# Patient Record
Sex: Female | Born: 1945 | Race: White | Hispanic: No | Marital: Married | Smoking: Former smoker
Health system: Southern US, Community
[De-identification: ages and names within clinical notes are randomized; demographics above are authoritative.]

## PROBLEM LIST (undated history)

## (undated) DIAGNOSIS — I639 Cerebral infarction, unspecified: Secondary | ICD-10-CM

## (undated) DIAGNOSIS — E7800 Pure hypercholesterolemia, unspecified: Secondary | ICD-10-CM

## (undated) DIAGNOSIS — I10 Essential (primary) hypertension: Secondary | ICD-10-CM

## (undated) HISTORY — DX: Essential (primary) hypertension: I10

## (undated) HISTORY — DX: Cerebral infarction, unspecified: I63.9

## (undated) HISTORY — PX: PLACEMENT OF BREAST IMPLANTS: SHX6334

## (undated) HISTORY — DX: Pure hypercholesterolemia, unspecified: E78.00

---

## 2015-03-28 ENCOUNTER — Emergency Department
Admission: EM | Admit: 2015-03-28 | Discharge: 2015-03-28 | Disposition: A | Discharge status: Other Acute Inpatient Hospital | Payer: BLUE CROSS/BLUE SHIELD | Source: Home / Self Care

## 2016-12-01 ENCOUNTER — Encounter: Payer: Self-pay | Admitting: Sports Medicine

## 2016-12-01 ENCOUNTER — Ambulatory Visit (INDEPENDENT_AMBULATORY_CARE_PROVIDER_SITE_OTHER): Payer: BLUE CROSS/BLUE SHIELD | Admitting: Sports Medicine

## 2016-12-01 ENCOUNTER — Ambulatory Visit (INDEPENDENT_AMBULATORY_CARE_PROVIDER_SITE_OTHER): Payer: BLUE CROSS/BLUE SHIELD

## 2016-12-01 DIAGNOSIS — R4702 Dysphasia: Secondary | ICD-10-CM

## 2016-12-01 DIAGNOSIS — Z0000 Encounter for general adult medical examination without abnormal findings: Secondary | ICD-10-CM | POA: Insufficient documentation

## 2016-12-01 DIAGNOSIS — E785 Hyperlipidemia, unspecified: Secondary | ICD-10-CM | POA: Diagnosis not present

## 2016-12-01 DIAGNOSIS — I639 Cerebral infarction, unspecified: Secondary | ICD-10-CM

## 2016-12-01 DIAGNOSIS — I10 Essential (primary) hypertension: Secondary | ICD-10-CM

## 2016-12-01 MED ORDER — LISINOPRIL-HYDROCHLOROTHIAZIDE 20-25 MG PO TABS: 0.5000 | ORAL_TABLET | Freq: Every day | ORAL | 3 refills | Status: DC

## 2016-12-01 MED ORDER — ASPIRIN EC 81 MG PO TBEC: 81.0000 mg | DELAYED_RELEASE_TABLET | Freq: Every day | ORAL | 3 refills | Status: DC

## 2016-12-01 NOTE — Progress Notes (Signed)
  Subjective:    CC: Establish care.   HPI:  This is a pleasant 71 year old female, she has a history of strokes in the past, uncontrolled hypertension. She was an avid smoker. A week ago she developed a episode of aphasia followed by dysphasia with slurred speech. This was while on an airplane, she never sought medical care afterwards and has not had any imaging. She has had some improvement but has persistent slurring of her speech, no new onset symptoms over the past week. No weakness or numbness on either side of the body. No headaches, visual changes, chest pain.  Past medical history:  Negative.  See flowsheet/record as well for more information.  Surgical history: Negative.  See flowsheet/record as well for more information.  Family history: Negative.  See flowsheet/record as well for more information.  Social history: Negative.  See flowsheet/record as well for more information.  Allergies, and medications have been entered into the medical record, reviewed, and no changes needed.    Review of Systems: No headache, visual changes, nausea, vomiting, diarrhea, constipation, dizziness, abdominal pain, skin rash, fevers, chills, night sweats, swollen lymph nodes, weight loss, chest pain, body aches, joint swelling, muscle aches, shortness of breath, mood changes, visual or auditory hallucinations.  Objective:    General: Well Developed, well nourished, and in no acute distress.  Neuro: Alert and oriented x3, extra-ocular muscles intact, sensation grossly intact. Cranial nerves II through XII are intact, motor, sensory, coordinative functions are intact, she does speak with some degree of dysphasia. No dysdiadochokinesis, no finger dysmetria, no pronator drift.   HEENT: Normocephalic, atraumatic, pupils equal round reactive to light, neck supple, no masses, no lymphadenopathy, thyroid nonpalpable.  Skin: Warm and dry, no rashes noted.  Cardiac: Regular rate and rhythm, no murmurs rubs or  gallops.  Respiratory: Clear to auscultation bilaterally. Not using accessory muscles, speaking in full sentences.  Abdominal: Soft, nontender, nondistended, positive bowel sounds, no masses, no organomegaly.  Musculoskeletal: Shoulder, elbow, wrist, hip, knee, ankle stable, and with full range of motion.  Twelve-lead ECG personally reviewed, shows normal sinus rhythm at a rate of 78 bpm, normal axis, no PR interval abnormalities and no ST changes.  Impression and Recommendations:    The patient was counselled, risk factors were discussed, anticipatory guidance given.  Benign essential hypertension Starting lisinopril/HCTZ, checking blood work.  Annual physical exam Mammogram, DEXA scan ordered. I will catch her up on screening tests at the follow-up visit. She will need her pneumonia 13, flu vaccine, tetanus vaccine, and we will discuss colon cancer screening at that time.  Dysphasia History of stroke in the past, we can go while on a plane she developed severe dysphagia, was never treated. CT of the head will be done stat today. Continue aspirin 81 mg. Adding a brain MRI with and without contrast, carotid Dopplers, echocardiogram, electrocardiogram, referral to neurology.

## 2016-12-01 NOTE — Assessment & Plan Note (Addendum)
Mammogram, DEXA scan ordered. I will catch her up on screening tests at the follow-up visit. She will need her pneumonia 13, flu vaccine, tetanus vaccine, and we will discuss colon cancer screening at that time.

## 2016-12-01 NOTE — Assessment & Plan Note (Signed)
Starting lisinopril/HCTZ, checking blood work.

## 2016-12-01 NOTE — Addendum Note (Signed)
Addended by: Collie SiadICHARDSON, Vernadette Stutsman M on: 12/01/2016 04:20 PM   Modules accepted: Orders

## 2016-12-01 NOTE — Assessment & Plan Note (Addendum)
History of stroke in the past, we can go while on a plane she developed severe dysphagia, was never treated. CT of the head will be done stat today. Continue aspirin 81 mg. Adding a brain MRI with and without contrast, carotid Dopplers, echocardiogram, electrocardiogram, referral to neurology.  MRI confirms subacute right thalamic infarct.  Called patient, she is feeling better and dysphasia is stable. No issues with BP medication and dizziness has resolved. Keep close followup this coming week.

## 2016-12-02 ENCOUNTER — Ambulatory Visit (HOSPITAL_BASED_OUTPATIENT_CLINIC_OR_DEPARTMENT_OTHER)
Admission: RE | Admit: 2016-12-02 | Discharge: 2016-12-02 | Disposition: A | Discharge status: Home / Self Care | Payer: BLUE CROSS/BLUE SHIELD | Source: Ambulatory Visit | Attending: Sports Medicine | Admitting: Sports Medicine

## 2016-12-02 DIAGNOSIS — I638 Other cerebral infarction: Secondary | ICD-10-CM | POA: Diagnosis not present

## 2016-12-02 DIAGNOSIS — I6782 Cerebral ischemia: Secondary | ICD-10-CM | POA: Diagnosis not present

## 2016-12-02 DIAGNOSIS — R4702 Dysphasia: Secondary | ICD-10-CM | POA: Insufficient documentation

## 2016-12-02 LAB — CBC
HCT: 41.2 % (ref 35.0–45.0)
Hemoglobin: 13.5 g/dL (ref 11.7–15.5)
MCH: 32.1 pg (ref 27.0–33.0)
MCHC: 32.8 g/dL (ref 32.0–36.0)
MCV: 98.1 fL (ref 80.0–100.0)
MPV: 12.9 fL — ABNORMAL HIGH (ref 7.5–12.5)
Platelets: 274 10*3/uL (ref 140–400)
RBC: 4.2 MIL/uL (ref 3.80–5.10)
RDW: 14.4 % (ref 11.0–15.0)
WBC: 7 K/uL (ref 3.8–10.8)

## 2016-12-02 LAB — LIPID PANEL W/REFLEX DIRECT LDL
Cholesterol: 263 mg/dL — ABNORMAL HIGH (ref <200)
HDL: 95 mg/dL (ref >50)
LDL-Cholesterol: 149 mg/dL — ABNORMAL HIGH
Non-HDL Cholesterol (Calc): 168 mg/dL — ABNORMAL HIGH (ref <130)
Total CHOL/HDL Ratio: 2.8 ratio (ref <5.0)
Triglycerides: 82 mg/dL (ref <150)

## 2016-12-02 LAB — COMPREHENSIVE METABOLIC PANEL
ALT: 16 U/L (ref 6–29)
Alkaline Phosphatase: 63 U/L (ref 33–130)
BUN: 10 mg/dL (ref 7–25)
Calcium: 9.8 mg/dL (ref 8.6–10.4)
Chloride: 101 mmol/L (ref 98–110)
Potassium: 4.6 mmol/L (ref 3.5–5.3)
Total Protein: 7.4 g/dL (ref 6.1–8.1)

## 2016-12-02 LAB — HEMOGLOBIN A1C
Hgb A1c MFr Bld: 4.9 % (ref <5.7)
Mean Plasma Glucose: 94 mg/dL

## 2016-12-02 LAB — COMPREHENSIVE METABOLIC PANEL WITH GFR
AST: 20 U/L (ref 10–35)
Albumin: 4.5 g/dL (ref 3.6–5.1)
CO2: 24 mmol/L (ref 20–31)
Creat: 0.64 mg/dL (ref 0.60–0.93)
Glucose, Bld: 95 mg/dL (ref 65–99)
Sodium: 138 mmol/L (ref 135–146)
Total Bilirubin: 0.6 mg/dL (ref 0.2–1.2)

## 2016-12-02 LAB — TSH: TSH: 1.2 mIU/L

## 2016-12-02 LAB — HEPATITIS C ANTIBODY: HCV Ab: NEGATIVE

## 2016-12-02 MED ORDER — GADOBENATE DIMEGLUMINE 529 MG/ML IV SOLN: 10.0000 mL | Freq: Once | INTRAVENOUS | Status: DC | PRN

## 2016-12-03 DIAGNOSIS — E785 Hyperlipidemia, unspecified: Secondary | ICD-10-CM | POA: Insufficient documentation

## 2016-12-03 LAB — HIV ANTIBODY (ROUTINE TESTING W REFLEX): HIV 1&2 Ab, 4th Generation: NONREACTIVE

## 2016-12-03 MED ORDER — ATORVASTATIN CALCIUM 20 MG PO TABS: 20.0000 mg | ORAL_TABLET | Freq: Every day | ORAL | 3 refills | Status: DC

## 2016-12-03 NOTE — Addendum Note (Signed)
Addended by: Monica BectonHEKKEKANDAM, Calbert Hulsebus J on: 12/03/2016 09:40 AM   Modules accepted: Orders

## 2016-12-03 NOTE — Assessment & Plan Note (Signed)
With CVA on brain MRI, discussed with patient, adding lipitor. Recheck in 3 months.

## 2016-12-05 ENCOUNTER — Encounter: Payer: Self-pay | Admitting: Sports Medicine

## 2016-12-05 ENCOUNTER — Ambulatory Visit (INDEPENDENT_AMBULATORY_CARE_PROVIDER_SITE_OTHER): Payer: BLUE CROSS/BLUE SHIELD | Admitting: Sports Medicine

## 2016-12-05 DIAGNOSIS — I10 Essential (primary) hypertension: Secondary | ICD-10-CM

## 2016-12-05 DIAGNOSIS — I639 Cerebral infarction, unspecified: Secondary | ICD-10-CM | POA: Diagnosis not present

## 2016-12-05 DIAGNOSIS — E785 Hyperlipidemia, unspecified: Secondary | ICD-10-CM

## 2016-12-05 NOTE — Progress Notes (Signed)
  Subjective:    CC: Follow-up  HPI: This is a pleasant 71 year old female, she came in with a several day history of difficulty speaking, slurred speech. She has a history of strokes in the past. Subsequent urgent MRI with and without contrast showed an acute right thalamic infarct. Overall her speech improved, she is only left with a bit of numbness in her tongue and slightly slurred speech. Her blood pressure was very elevated, and has improved with starting antihypertensives. She is also doing well with her atorvastatin and aspirin. She has her echocardiogram scheduled, carotid Dopplers are not yet scheduled. She declined her neurology appointment until talking to me, I have encouraged her to keep this appointment.  Past medical history:  Negative.  See flowsheet/record as well for more information.  Surgical history: Negative.  See flowsheet/record as well for more information.  Family history: Negative.  See flowsheet/record as well for more information.  Social history: Negative.  See flowsheet/record as well for more information.  Allergies, and medications have been entered into the medical record, reviewed, and no changes needed.   Review of Systems: No fevers, chills, night sweats, weight loss, chest pain, or shortness of breath.   Objective:    General: Well Developed, well nourished, and in no acute distress.  Neuro: Alert and oriented x3, extra-ocular muscles intact, sensation grossly intact.  HEENT: Normocephalic, atraumatic, pupils equal round reactive to light, neck supple, no masses, no lymphadenopathy, thyroid nonpalpable.  Skin: Warm and dry, no rashes. Cardiac: Regular rate and rhythm, no murmurs rubs or gallops, no lower extremity edema.  Respiratory: Clear to auscultation bilaterally. Not using accessory muscles, speaking in full sentences.  Impression and Recommendations:    CVA (cerebral vascular accident) (HCC) Subacute right thalamic infarct, also multiple areas  of brain tissue loss from previous strokes. Awaiting carotid Dopplers, echocardiogram. She did not make her neurology appointment, I have advised her to do so. Continue aspirin 81, we are going to aggressively control her blood pressure and cholesterol.  Benign essential hypertension Increasing to a full tablet lisinopril/HCTZ. Return in one week.  Hyperlipidemia LDL goal <100 Continue atorvastatin for now.  I spent 25 minutes with this patient, greater than 50% was face-to-face time counseling regarding the above diagnoses

## 2016-12-05 NOTE — Assessment & Plan Note (Signed)
Subacute right thalamic infarct, also multiple areas of brain tissue loss from previous strokes. Awaiting carotid Dopplers, echocardiogram. She did not make her neurology appointment, I have advised her to do so. Continue aspirin 81, we are going to aggressively control her blood pressure and cholesterol.

## 2016-12-05 NOTE — Assessment & Plan Note (Signed)
Continue atorvastatin for now. 

## 2016-12-05 NOTE — Assessment & Plan Note (Signed)
Increasing to a full tablet lisinopril/HCTZ. Return in one week.

## 2016-12-06 ENCOUNTER — Other Ambulatory Visit (HOSPITAL_BASED_OUTPATIENT_CLINIC_OR_DEPARTMENT_OTHER): Payer: Medicare Other

## 2016-12-06 ENCOUNTER — Ambulatory Visit (HOSPITAL_BASED_OUTPATIENT_CLINIC_OR_DEPARTMENT_OTHER)
Admission: RE | Admit: 2016-12-06 | Discharge: 2016-12-06 | Disposition: A | Discharge status: Home / Self Care | Payer: BLUE CROSS/BLUE SHIELD | Source: Ambulatory Visit | Attending: Sports Medicine | Admitting: Sports Medicine

## 2016-12-06 DIAGNOSIS — I6523 Occlusion and stenosis of bilateral carotid arteries: Secondary | ICD-10-CM | POA: Insufficient documentation

## 2016-12-06 DIAGNOSIS — R4702 Dysphasia: Secondary | ICD-10-CM | POA: Diagnosis not present

## 2016-12-12 ENCOUNTER — Ambulatory Visit (INDEPENDENT_AMBULATORY_CARE_PROVIDER_SITE_OTHER): Payer: BLUE CROSS/BLUE SHIELD

## 2016-12-12 ENCOUNTER — Encounter: Payer: Self-pay | Admitting: Sports Medicine

## 2016-12-12 ENCOUNTER — Ambulatory Visit (INDEPENDENT_AMBULATORY_CARE_PROVIDER_SITE_OTHER): Payer: BLUE CROSS/BLUE SHIELD | Admitting: Sports Medicine

## 2016-12-12 VITALS — BP 152/90 | HR 106 | Resp 16 | Wt 120.1 lb

## 2016-12-12 DIAGNOSIS — Z0000 Encounter for general adult medical examination without abnormal findings: Secondary | ICD-10-CM

## 2016-12-12 DIAGNOSIS — I639 Cerebral infarction, unspecified: Secondary | ICD-10-CM | POA: Diagnosis not present

## 2016-12-12 DIAGNOSIS — Z23 Encounter for immunization: Secondary | ICD-10-CM

## 2016-12-12 DIAGNOSIS — M47812 Spondylosis without myelopathy or radiculopathy, cervical region: Secondary | ICD-10-CM | POA: Diagnosis not present

## 2016-12-12 DIAGNOSIS — I10 Essential (primary) hypertension: Secondary | ICD-10-CM | POA: Diagnosis not present

## 2016-12-12 DIAGNOSIS — M542 Cervicalgia: Secondary | ICD-10-CM | POA: Diagnosis not present

## 2016-12-12 DIAGNOSIS — G8929 Other chronic pain: Secondary | ICD-10-CM

## 2016-12-12 MED ORDER — AMLODIPINE BESYLATE 5 MG PO TABS: 5.0000 mg | ORAL_TABLET | Freq: Every day | ORAL | 3 refills | Status: DC

## 2016-12-12 MED ORDER — ACETAMINOPHEN ER 650 MG PO TBCR: 650.0000 mg | EXTENDED_RELEASE_TABLET | Freq: Three times a day (TID) | ORAL | 3 refills | Status: DC | PRN

## 2016-12-12 NOTE — Progress Notes (Signed)
  Subjective:    CC: Follow-up  HPI: This is a pleasant 71 year old female, she just had a thalamic stroke, overall improving, still has some slurred speech. Blood pressure continues to improve, still awaiting echocardiogram, carotid Dopplers showed nonobstructive atherosclerosis.  Hyperlipidemia: Continue with cholesterol medication.  Left sided shoulder pain: Radiating from the neck, nothing overtly radicular, worse with turning neck to the left side, not worse with moving the shoulder or overhead activities.  Past medical history:  Negative.  See flowsheet/record as well for more information.  Surgical history: Negative.  See flowsheet/record as well for more information.  Family history: Negative.  See flowsheet/record as well for more information.  Social history: Negative.  See flowsheet/record as well for more information.  Allergies, and medications have been entered into the medical record, reviewed, and no changes needed.   Review of Systems: No fevers, chills, night sweats, weight loss, chest pain, or shortness of breath.   Objective:    General: Well Developed, well nourished, and in no acute distress.  Neuro: Alert and oriented x3, extra-ocular muscles intact, sensation grossly intact. Mild slurred speech HEENT: Normocephalic, atraumatic, pupils equal round reactive to light, neck supple, no masses, no lymphadenopathy, thyroid nonpalpable.  Skin: Warm and dry, no rashes. Cardiac: Regular rate and rhythm, no murmurs rubs or gallops, no lower extremity edema.  Respiratory: Clear to auscultation bilaterally. Not using accessory muscles, speaking in full sentences. Neck: Negative spurling's Full neck range of motion Grip strength and sensation normal in bilateral hands Strength good C4 to T1 distribution No sensory change to C4 to T1 Reflexes normal Shoulder: Inspection reveals no abnormalities, atrophy or asymmetry. Palpation is normal with no tenderness over AC joint or  bicipital groove. ROM is full in all planes. Rotator cuff strength normal throughout. No signs of impingement with negative Neer and Hawkin's tests, empty can. Speeds and Yergason's tests normal. No labral pathology noted with negative Obrien's, negative crank, negative clunk, and good stability. Normal scapular function observed. No painful arc and no drop arm sign. No apprehension sign  Impression and Recommendations:    Annual physical exam Tetanus shot, pneumonia 13. Ordering mammogram, DEXA scan, ColoGuard testing.   Benign essential hypertension Continues to improve. Adding 5 mg of amlodipine, return in 2 weeks.  CVA (cerebral vascular accident) (HCC) Subacute right thalamic infarct with multiple areas of brain tissue loss from previous strokes. Persistent mild dysphasia Carotid Dopplers show nonobstructive atherosclerosis as expected, still awaiting echocardiogram. She does have appointment with neurology in April and she will continue aspirin.  Cervical spondylosis Tylenol over-the-counter, neck x-rays, home rehabilitation exercises given. Return in one month, MRI for consideration of injection if no better.

## 2016-12-12 NOTE — Assessment & Plan Note (Addendum)
Continues to improve. Adding 5 mg of amlodipine, return in 2 weeks.

## 2016-12-12 NOTE — Assessment & Plan Note (Signed)
Tylenol over-the-counter, neck x-rays, home rehabilitation exercises given. Return in one month, MRI for consideration of injection if no better.

## 2016-12-12 NOTE — Assessment & Plan Note (Signed)
Tetanus shot, pneumonia 13. Ordering mammogram, DEXA scan, ColoGuard testing.

## 2016-12-12 NOTE — Assessment & Plan Note (Signed)
Subacute right thalamic infarct with multiple areas of brain tissue loss from previous strokes. Persistent mild dysphasia Carotid Dopplers show nonobstructive atherosclerosis as expected, still awaiting echocardiogram. She does have appointment with neurology in April and she will continue aspirin.

## 2016-12-18 ENCOUNTER — Other Ambulatory Visit: Payer: Self-pay | Admitting: Sports Medicine

## 2016-12-18 DIAGNOSIS — Z780 Asymptomatic menopausal state: Secondary | ICD-10-CM

## 2016-12-18 DIAGNOSIS — Z1239 Encounter for other screening for malignant neoplasm of breast: Secondary | ICD-10-CM

## 2016-12-18 DIAGNOSIS — M810 Age-related osteoporosis without current pathological fracture: Secondary | ICD-10-CM

## 2016-12-20 ENCOUNTER — Telehealth: Payer: Self-pay

## 2016-12-20 NOTE — Telephone Encounter (Signed)
Message from answering service:  CANCEL APPT 12/25/16@3 :00PM &WANTS [facility] TO CALL      TO RESCHEDULE

## 2016-12-21 ENCOUNTER — Ambulatory Visit (HOSPITAL_BASED_OUTPATIENT_CLINIC_OR_DEPARTMENT_OTHER)
Admission: RE | Admit: 2016-12-21 | Discharge: 2016-12-21 | Disposition: A | Discharge status: Home / Self Care | Payer: BLUE CROSS/BLUE SHIELD | Source: Ambulatory Visit | Attending: Sports Medicine | Admitting: Sports Medicine

## 2016-12-21 DIAGNOSIS — I10 Essential (primary) hypertension: Secondary | ICD-10-CM | POA: Diagnosis not present

## 2016-12-21 DIAGNOSIS — R4702 Dysphasia: Secondary | ICD-10-CM | POA: Diagnosis not present

## 2016-12-21 DIAGNOSIS — I639 Cerebral infarction, unspecified: Secondary | ICD-10-CM | POA: Diagnosis not present

## 2016-12-21 DIAGNOSIS — I348 Other nonrheumatic mitral valve disorders: Secondary | ICD-10-CM | POA: Insufficient documentation

## 2016-12-21 LAB — ECHOCARDIOGRAM COMPLETE
E decel time: 225 ms
E/e' ratio: 8.01
FS: 34 % (ref 28–44)
IVS/LV PW RATIO, ED: 0.8
LA ID, A-P, ES: 32 mm
LA diam end sys: 32 mm
LA diam index: 2.06 cm/m2
LA vol A4C: 26.4 ml
LA vol index: 18.7 mL/m2
LA vol: 29 mL
LV E/e' medial: 8.01
LV E/e'average: 8.01
LV PW d: 12.7 mm — AB (ref 0.6–1.1)
LV e' LATERAL: 7.72 cm/s
LVOT SV: 81 mL
LVOT VTI: 25.9 cm
LVOT area: 3.14 cm2
LVOT diameter: 20 mm
LVOT peak grad rest: 7 mmHg
LVOT peak vel: 133 cm/s
Lateral S' vel: 9.79 cm/s
MV Dec: 225
MV pk A vel: 103 m/s
MV pk E vel: 61.8 m/s
TAPSE: 16.8 mm
TDI e' lateral: 7.72
TDI e' medial: 5.22

## 2016-12-21 NOTE — Progress Notes (Signed)
  Echocardiogram 2D Echocardiogram has been performed.  Delcie Roch 12/21/2016, 9:14 AM

## 2016-12-21 NOTE — Telephone Encounter (Signed)
Called and LM on VM to call and R/S appt dg

## 2016-12-25 ENCOUNTER — Ambulatory Visit: Payer: Medicare Other | Admitting: Neurology

## 2016-12-26 ENCOUNTER — Ambulatory Visit (INDEPENDENT_AMBULATORY_CARE_PROVIDER_SITE_OTHER): Payer: BLUE CROSS/BLUE SHIELD

## 2016-12-26 DIAGNOSIS — M810 Age-related osteoporosis without current pathological fracture: Secondary | ICD-10-CM | POA: Insufficient documentation

## 2016-12-26 DIAGNOSIS — Z1231 Encounter for screening mammogram for malignant neoplasm of breast: Secondary | ICD-10-CM | POA: Diagnosis not present

## 2016-12-26 MED ORDER — ALENDRONATE SODIUM 70 MG PO TABS: 70.0000 mg | ORAL_TABLET | ORAL | 11 refills | Status: DC

## 2016-12-26 NOTE — Assessment & Plan Note (Signed)
Starting Fosamax. 

## 2016-12-27 ENCOUNTER — Encounter: Payer: Self-pay | Admitting: Sports Medicine

## 2016-12-27 ENCOUNTER — Ambulatory Visit (INDEPENDENT_AMBULATORY_CARE_PROVIDER_SITE_OTHER): Payer: BLUE CROSS/BLUE SHIELD | Admitting: Sports Medicine

## 2016-12-27 DIAGNOSIS — I10 Essential (primary) hypertension: Secondary | ICD-10-CM | POA: Diagnosis not present

## 2016-12-27 DIAGNOSIS — I639 Cerebral infarction, unspecified: Secondary | ICD-10-CM

## 2016-12-27 MED ORDER — AMLODIPINE BESYLATE 10 MG PO TABS: 10.0000 mg | ORAL_TABLET | Freq: Every day | ORAL | 3 refills | Status: DC

## 2016-12-27 NOTE — Progress Notes (Signed)
  Subjective:    CC: Follow-up  HPI: Osteoporosis: New diagnosis, Fosamax called in.  Hypertension: Continues to improve.  Past medical history:  Negative.  See flowsheet/record as well for more information.  Surgical history: Negative.  See flowsheet/record as well for more information.  Family history: Negative.  See flowsheet/record as well for more information.  Social history: Negative.  See flowsheet/record as well for more information.  Allergies, and medications have been entered into the medical record, reviewed, and no changes needed.   Review of Systems: No fevers, chills, night sweats, weight loss, chest pain, or shortness of breath.   Objective:    General: Well Developed, well nourished, and in no acute distress.  Neuro: Alert and oriented x3, extra-ocular muscles intact, sensation grossly intact.  HEENT: Normocephalic, atraumatic, pupils equal round reactive to light, neck supple, no masses, no lymphadenopathy, thyroid nonpalpable.  Skin: Warm and dry, no rashes. Cardiac: Regular rate and rhythm, no murmurs rubs or gallops, no lower extremity edema.  Respiratory: Clear to auscultation bilaterally. Not using accessory muscles, speaking in full sentences.  Impression and Recommendations:    Benign essential hypertension Doing extremely well, increasing amlodipine to a full 10 mg, no other changes. Return in one month.

## 2016-12-27 NOTE — Assessment & Plan Note (Signed)
Doing extremely well, increasing amlodipine to a full 10 mg, no other changes. Return in one month.

## 2017-01-11 ENCOUNTER — Encounter (INDEPENDENT_AMBULATORY_CARE_PROVIDER_SITE_OTHER): Payer: Self-pay

## 2017-01-11 ENCOUNTER — Ambulatory Visit (INDEPENDENT_AMBULATORY_CARE_PROVIDER_SITE_OTHER): Payer: BLUE CROSS/BLUE SHIELD | Admitting: Neurology

## 2017-01-11 ENCOUNTER — Encounter: Payer: Self-pay | Admitting: Neurology

## 2017-01-11 VITALS — BP 139/86 | HR 86 | Ht 62.5 in | Wt 118.5 lb

## 2017-01-11 DIAGNOSIS — I639 Cerebral infarction, unspecified: Secondary | ICD-10-CM

## 2017-01-11 MED ORDER — CLOPIDOGREL BISULFATE 75 MG PO TABS: 75.0000 mg | ORAL_TABLET | Freq: Every day | ORAL | 3 refills | Status: DC

## 2017-01-11 NOTE — Progress Notes (Signed)
Reason for visit: Stroke  Referring physician: Dr. Gilmer [name] is a 71 y.o. female  History of present illness:  Dawn Huerta is a 71 year old right-handed white female with a history of cerebrovascular disease spanning over 50 years. The patient claims that she had her first stroke when she was in her early 33s associated with right face and arm numbness. Several years later she had a more significant stroke affecting the right side of the body. This was attributed to the use of birth control pills and she was taken off of birth control and she did well until around age 50. The patient at that time was placed on low-dose estrogen for menopausal symptoms, and she suffered another stroke at that time. Around 11/24/2016 she began noting onset of slurred speech, she did not feel well in general. The patient was evaluated several days later, her primary physician set her up for MRI evaluation of the brain which confirmed an acute right thalamic stroke. The patient has extensive white matter changes that are chronic. She had been on aspirin at the time of the stroke. The patient has a history of migraine equivalent with squiggly crescent-shaped lines in the vision. These visual changes were noted around the time of the stroke event. The patient has chronic right hand numbness that predated the recent stroke. She denies any weakness and she denies any problems controlling the bowels or the bladder. She does report some mild gait instability. She has undergone a carotid Doppler study and a 2-D echocardiogram since the stroke which have been unremarkable. The patient is sent to this office for an evaluation. She remains on low-dose aspirin.  Past Medical History:  Diagnosis Date  . High cholesterol   . Hypertension   . Stroke [facility])    3    Past Surgical History:  Procedure Laterality Date  . PLACEMENT OF BREAST IMPLANTS  1970's    Family History  Problem Relation Age of  Onset  . Cancer Father   . Breast cancer Sister   . Heart disease Paternal Uncle   . Lung cancer Maternal Grandfather     Social history:  reports that she has quit smoking. She has never used smokeless tobacco. She reports that she drinks about 3.0 oz of alcohol per week . She reports that she does not use drugs.  Medications:  Prior to Admission medications   Medication Sig Start Date End Date Taking? Authorizing Provider  acetaminophen (TYLENOL) 650 MG CR tablet Take 1 tablet (650 mg total) by mouth every 8 (eight) hours as needed for pain. 12/12/16  Yes Monica Becton, MD  alendronate (FOSAMAX) 70 MG tablet Take 1 tablet (70 mg total) by mouth every 7 (seven) days. 12/26/16  Yes Monica Becton, MD  amLODipine (NORVASC) 10 MG tablet Take 1 tablet (10 mg total) by mouth daily. 12/27/16  Yes Monica Becton, MD  aspirin EC 81 MG tablet Take 1 tablet (81 mg total) by mouth daily. 12/01/16  Yes Monica Becton, MD  atorvastatin (LIPITOR) 20 MG tablet Take 1 tablet (20 mg total) by mouth daily. 12/03/16  Yes Monica Becton, MD  Glucosamine HCl (GLUCOSAMINE PO) Take by mouth daily.   Yes Historical Provider, MD  lisinopril-hydrochlorothiazide (PRINZIDE,ZESTORETIC) 20-25 MG tablet Take 1 tablet by mouth daily. 12/05/16  Yes Monica Becton, MD  Multiple Vitamin (MULTIVITAMIN) tablet Take 1 tablet by mouth daily.   Yes Historical Provider, MD     No Known  Allergies  ROS:  Out of a complete 14 system review of symptoms, the patient complains only of the following symptoms, and all other reviewed systems are negative.  Difficulty swallowing Joint pain Numbness, slurred speech  Blood pressure 139/86, pulse 86, height 5' 2.5" (1.588 m), weight 118 lb 8 oz (53.8 kg).  Physical Exam  General: The patient is alert and cooperative at the time of the examination.  Eyes: Pupils are equal, round, and reactive to light. Discs are flat bilaterally.  Neck: The  neck is supple, no carotid bruits are noted.  Respiratory: The respiratory examination is clear.  Cardiovascular: The cardiovascular examination reveals a regular rate and rhythm, no obvious murmurs or rubs are noted.  Skin: Extremities are without significant edema.  Neurologic Exam  Mental status: The patient is alert and oriented x 3 at the time of the examination. The patient has apparent normal recent and remote memory, with an apparently normal attention span and concentration ability.  Cranial nerves: Facial symmetry is present. There is good sensation of the face to pinprick and soft touch bilaterally. The strength of the facial muscles and the muscles to head turning and shoulder shrug are normal bilaterally. Speech is well enunciated, no aphasia or dysarthria is noted. Extraocular movements are full. Visual fields are full. The tongue is midline, and the patient has symmetric elevation of the soft palate. No obvious hearing deficits are noted.  Motor: The motor testing reveals 5 over 5 strength of all 4 extremities. Good symmetric motor tone is noted throughout.  Sensory: Sensory testing is intact to pinprick, soft touch, vibration sensation, and position sense on all 4 extremities, with exception that there is some slight decrease in pinprick sensation on the left forearm and hand. No evidence of extinction is noted.  Coordination: Cerebellar testing reveals good finger-nose-finger and heel-to-shin bilaterally.  Gait and station: Gait is normal. Tandem gait is normal. Romberg is negative. No drift is seen.  Reflexes: Deep tendon reflexes are symmetric and normal bilaterally. Toes are downgoing bilaterally.   MRI brain 12/02/16:  IMPRESSION: 1. Acute to early subacute lacunar infarct in the right thalamus. 2. Advanced chronic small vessel ischemic disease. Chronic cerebral and cerebellar infarcts as above.  * MRI scan images were reviewed online. I agree with the written  report.   Carotid doppler study 12/06/16:  IMPRESSION: Mild atherosclerotic disease in the bilateral carotid arteries. Estimated degree of stenosis in the internal carotid arteries is less than 50% bilaterally.  Patent vertebral arteries.   2D echo 12/21/16:  Study Conclusions  - Left ventricle: The cavity size was normal. Systolic function was   normal. The estimated ejection fraction was in the range of 60%   to 65%. Wall motion was normal; there were no regional wall   motion abnormalities. Doppler parameters are consistent with   abnormal left ventricular relaxation (grade 1 diastolic   dysfunction). - Mitral valve: Calcified annulus. - Atrial septum: No defect or patent foramen ovale was identified.    Assessment/Plan:  1. Cerebrovascular disease, recent right thalamic stroke  The patient indicates that she has had episodes of stroke since she was in her 80s. The patient will be sent for further blood work to include a hypercoagulable state evaluation. The patient will undergo a 30 day cardiac monitor study. She will be taken off of aspirin and switched to Plavix. I will contact her when get the results of the above evaluation.  Marlan Palau MD 01/11/2017 4:05 PM  [facility] [address] [place], [place] [address]  Phone [phone] Fax [phone]

## 2017-01-11 NOTE — Patient Instructions (Signed)
   We will get blood work today and get a 30 day heart monitor study. Stop the aspirin and we will start plavix.

## 2017-01-19 ENCOUNTER — Telehealth: Payer: Self-pay | Admitting: Neurology

## 2017-01-19 LAB — FACTOR V LEIDEN

## 2017-01-19 LAB — HYPERCOAGULABLE PANEL, COMPREHENSIVE
ANTICARDIOLIPIN AB, IGM: 25 [MPL'U] — AB
APTT: 26.1 s
AT III ACT/NOR PPP CHRO: 137 % — AB
Act. Prt C Resist w/FV Defic.: 2 ratio — ABNORMAL LOW
BETA-2 GLYCOPROTEIN I, IGM: 16 SMU
Beta-2 Glycoprotein I, IgA: <10 SAU
Beta-2 Glycoprotein I, IgG: <10 SGU
DRVVT Screen Seconds: 34.5 s
Factor VII Antigen**: 157 %
Factor VIII Activity: 141 %
Hexagonal Phospholipid Neutral: 14 s — ABNORMAL HIGH
Homocysteine: 13.2 umol/L
PROT C AG ACT/NOR PPP IMM: 110 %
PROT S AG ACT/NOR PPP IMM: 86 %
PROTEIN C AG/FVII AG RATIO: 0.7 ratio
Protein S Ag/FVII Ag Ratio**: 0.5 ratio

## 2017-01-19 NOTE — Telephone Encounter (Signed)
I called the patient. The hypercoagulable state profile showed some elevation in the antiphospholipid antibodies. We will recheck in 6-8 weeks.  I called the patient, left message.

## 2017-01-24 ENCOUNTER — Ambulatory Visit: Payer: BLUE CROSS/BLUE SHIELD | Admitting: Sports Medicine

## 2017-01-29 ENCOUNTER — Encounter: Payer: Self-pay | Admitting: Sports Medicine

## 2017-01-29 ENCOUNTER — Ambulatory Visit (INDEPENDENT_AMBULATORY_CARE_PROVIDER_SITE_OTHER): Payer: BLUE CROSS/BLUE SHIELD | Admitting: Sports Medicine

## 2017-01-29 DIAGNOSIS — Z0000 Encounter for general adult medical examination without abnormal findings: Secondary | ICD-10-CM | POA: Diagnosis not present

## 2017-01-29 DIAGNOSIS — I639 Cerebral infarction, unspecified: Secondary | ICD-10-CM

## 2017-01-29 DIAGNOSIS — I10 Essential (primary) hypertension: Secondary | ICD-10-CM | POA: Diagnosis not present

## 2017-01-29 NOTE — Assessment & Plan Note (Signed)
Declines colonoscopy, I am going to set her up for ColoGuard.

## 2017-01-29 NOTE — Assessment & Plan Note (Signed)
Well-controlled, no changes, return in 6 months.

## 2017-01-29 NOTE — Progress Notes (Signed)
  Subjective:    CC: Follow-up  HPI: Blood pressure is now well controlled, regarding preventive measures, she declines colonoscopy but is agreeable to proceed with ColoGuard.  Past medical history:  Negative.  See flowsheet/record as well for more information.  Surgical history: Negative.  See flowsheet/record as well for more information.  Family history: Negative.  See flowsheet/record as well for more information.  Social history: Negative.  See flowsheet/record as well for more information.  Allergies, and medications have been entered into the medical record, reviewed, and no changes needed.   Review of Systems: No fevers, chills, night sweats, weight loss, chest pain, or shortness of breath.   Objective:    General: Well Developed, well nourished, and in no acute distress.  Neuro: Alert and oriented x3, extra-ocular muscles intact, sensation grossly intact.  HEENT: Normocephalic, atraumatic, pupils equal round reactive to light, neck supple, no masses, no lymphadenopathy, thyroid nonpalpable.  Skin: Warm and dry, no rashes. Cardiac: Regular rate and rhythm, no murmurs rubs or gallops, no lower extremity edema.  Respiratory: Clear to auscultation bilaterally. Not using accessory muscles, speaking in full sentences.  Impression and Recommendations:    Benign essential hypertension Well-controlled, no changes, return in 6 months.  Annual physical exam Declines colonoscopy, I am going to set her up for ColoGuard.

## 2017-02-06 ENCOUNTER — Other Ambulatory Visit: Payer: Self-pay | Admitting: Neurology

## 2017-02-06 ENCOUNTER — Ambulatory Visit (INDEPENDENT_AMBULATORY_CARE_PROVIDER_SITE_OTHER): Payer: BLUE CROSS/BLUE SHIELD

## 2017-02-06 DIAGNOSIS — I4891 Unspecified atrial fibrillation: Secondary | ICD-10-CM | POA: Diagnosis not present

## 2017-02-06 DIAGNOSIS — I639 Cerebral infarction, unspecified: Secondary | ICD-10-CM

## 2017-02-28 ENCOUNTER — Encounter: Payer: Self-pay | Admitting: Radiology

## 2017-03-20 ENCOUNTER — Telehealth: Payer: Self-pay | Admitting: Neurology

## 2017-03-20 DIAGNOSIS — D6859 Other primary thrombophilia: Secondary | ICD-10-CM

## 2017-03-20 NOTE — Telephone Encounter (Signed)
I called patient. She is to get a recheck on the antiphospholipid antibody panel, I will put orders in.

## 2017-04-16 ENCOUNTER — Telehealth: Payer: Self-pay | Admitting: Neurology

## 2017-04-16 ENCOUNTER — Other Ambulatory Visit: Payer: Self-pay | Admitting: Sports Medicine

## 2017-04-16 DIAGNOSIS — I10 Essential (primary) hypertension: Secondary | ICD-10-CM

## 2017-04-16 NOTE — Telephone Encounter (Signed)
  I called patient. The cardiac event monitor was unremarkable, the patient will stay on Plavix.    Cardiac event monitor 04/16/17:  Sinus rhythm No AV block or pauses No sustained arrhythmias No atrial fibrillation

## 2017-04-18 LAB — COLOGUARD: Cologuard: NEGATIVE

## 2017-05-04 ENCOUNTER — Encounter: Payer: Self-pay | Admitting: Sports Medicine

## 2017-07-12 ENCOUNTER — Ambulatory Visit (INDEPENDENT_AMBULATORY_CARE_PROVIDER_SITE_OTHER): Payer: BLUE CROSS/BLUE SHIELD | Admitting: Sports Medicine

## 2017-07-12 DIAGNOSIS — Z23 Encounter for immunization: Secondary | ICD-10-CM | POA: Diagnosis not present

## 2017-08-02 ENCOUNTER — Ambulatory Visit (INDEPENDENT_AMBULATORY_CARE_PROVIDER_SITE_OTHER): Payer: BLUE CROSS/BLUE SHIELD | Admitting: Sports Medicine

## 2017-08-02 ENCOUNTER — Encounter: Payer: Self-pay | Admitting: Sports Medicine

## 2017-08-02 DIAGNOSIS — J309 Allergic rhinitis, unspecified: Secondary | ICD-10-CM | POA: Diagnosis not present

## 2017-08-02 DIAGNOSIS — I639 Cerebral infarction, unspecified: Secondary | ICD-10-CM

## 2017-08-02 DIAGNOSIS — I10 Essential (primary) hypertension: Secondary | ICD-10-CM | POA: Diagnosis not present

## 2017-08-02 DIAGNOSIS — M47812 Spondylosis without myelopathy or radiculopathy, cervical region: Secondary | ICD-10-CM

## 2017-08-02 DIAGNOSIS — Z0000 Encounter for general adult medical examination without abnormal findings: Secondary | ICD-10-CM | POA: Diagnosis not present

## 2017-08-02 MED ORDER — MONTELUKAST SODIUM 10 MG PO TABS: 10.0000 mg | ORAL_TABLET | Freq: Every day | ORAL | 3 refills | Status: DC

## 2017-08-02 MED ORDER — LISINOPRIL-HYDROCHLOROTHIAZIDE 20-25 MG PO TABS: 1.0000 | ORAL_TABLET | Freq: Every day | ORAL | 3 refills | Status: DC

## 2017-08-02 MED ORDER — AMLODIPINE BESYLATE 10 MG PO TABS: 10.0000 mg | ORAL_TABLET | Freq: Every day | ORAL | 3 refills | Status: DC

## 2017-08-02 MED ORDER — ACETAMINOPHEN ER 650 MG PO TBCR: 650.0000 mg | EXTENDED_RELEASE_TABLET | Freq: Three times a day (TID) | ORAL | 3 refills | Status: DC | PRN

## 2017-08-02 NOTE — Assessment & Plan Note (Signed)
Multiple sensitivities to various exposures per patient. She can continue her antihistamines I am going to add Singulair, per her request I am going to do a referral to an allergist.

## 2017-08-02 NOTE — Assessment & Plan Note (Signed)
Stable persistent mild dysphasia post subacute right thalamic infarct with multiple areas of brain tissue loss from previous strokes. Carotid Dopplers showed nonobstructive atherosclerosis, echocardiogram was unremarkable, Holter monitor/30 days was unremarkable. Doing well on Plavix.

## 2017-08-02 NOTE — Progress Notes (Signed)
  Subjective:    CC: Follow-up  HPI: Hypertension: Stable.  Thalamic infarct: Stable dysphasia, doing well with blood pressure and Plavix.  Rhinitis: Notes multiple sensitivities to various irritants, would like referral to an allergist, she is taking an antihistamine, has not yet been on Singulair.  She is also requesting a referral to dermatology for skin checks.  Past medical history:  Negative.  See flowsheet/record as well for more information.  Surgical history: Negative.  See flowsheet/record as well for more information.  Family history: Negative.  See flowsheet/record as well for more information.  Social history: Negative.  See flowsheet/record as well for more information.  Allergies, and medications have been entered into the medical record, reviewed, and no changes needed.   Review of Systems: No fevers, chills, night sweats, weight loss, chest pain, or shortness of breath.   Objective:    General: Well Developed, well nourished, and in no acute distress.  Neuro: Alert and oriented x3, extra-ocular muscles intact, sensation grossly intact.  HEENT: Normocephalic, atraumatic, pupils equal round reactive to light, neck supple, no masses, no lymphadenopathy, thyroid nonpalpable.  Skin: Warm and dry, no rashes. Cardiac: Regular rate and rhythm, no murmurs rubs or gallops, no lower extremity edema.  Respiratory: Clear to auscultation bilaterally. Not using accessory muscles, speaking in full sentences.  Impression and Recommendations:    Benign essential hypertension Well-controlled, return in 6 months.  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 Carotid Dopplers showed nonobstructive atherosclerosis, echocardiogram was unremarkable, Holter monitor/30 days was unremarkable. Doing well on Plavix.   Allergic rhinitis Multiple sensitivities to  various exposures per patient. She can continue her antihistamines I am going to add Singulair, per her request I am going to do a referral to an allergist.  Annual physical exam Up-to-date on screening measures. Per patient request referral to dermatology for skin checks.  I spent 25 minutes with this patient, greater than 50% was face-to-face time counseling regarding the above diagnoses ___________________________________________ Ihor Austinhomas J. Benjamin Stainhekkekandam, M.D., ABFM., CAQSM. Primary Care and Sports Medicine [facility]  Adjunct Instructor of Family Medicine  [employer]

## 2017-08-02 NOTE — Assessment & Plan Note (Signed)
Up-to-date on screening measures. Per patient request referral to dermatology for skin checks.

## 2017-08-02 NOTE — Assessment & Plan Note (Signed)
Well-controlled, return in 6 months.

## 2017-09-05 ENCOUNTER — Ambulatory Visit: Payer: BLUE CROSS/BLUE SHIELD | Admitting: Allergy & Immunology

## 2017-10-23 ENCOUNTER — Ambulatory Visit (INDEPENDENT_AMBULATORY_CARE_PROVIDER_SITE_OTHER): Payer: BLUE CROSS/BLUE SHIELD | Admitting: Sports Medicine

## 2017-10-23 ENCOUNTER — Encounter: Payer: Self-pay | Admitting: Sports Medicine

## 2017-10-23 DIAGNOSIS — H6121 Impacted cerumen, right ear: Secondary | ICD-10-CM | POA: Diagnosis not present

## 2017-10-23 NOTE — Progress Notes (Signed)
Subjective:    CC: Ears plugged up  HPI: This is a pleasant 72 year old female, she was seen at her audiologist office, noted to have a cerumen impaction.  The audiologist attempted to remove this with instrumentation but unfortunately was unable, she is referred here for further evaluation and definitive treatment.  Left tympanic membrane and ear canal are unremarkable, symptoms are all on the right.  Difficulty hearing.  No vertigo, no pain.  I reviewed the past medical history, family history, social history, surgical history, and allergies today and no changes were needed.  Please see the problem list section below in epic for further details.  Past Medical History: Past Medical History:  Diagnosis Date  . High cholesterol   . Hypertension   . Stroke [facility](HCC)    3   Past Surgical History: Past Surgical History:  Procedure Laterality Date  . PLACEMENT OF BREAST IMPLANTS  1970's   Social History: Social History   Socioeconomic History  . Marital status: Married    Spouse name: Jonny RuizJohn  . Number of children: None  . Years of education: 1 yr college  . Highest education level: None  Social Needs  . Financial resource strain: None  . Food insecurity - worry: None  . Food insecurity - inability: None  . Transportation needs - medical: None  . Transportation needs - non-medical: None  Occupational History  . Occupation: Programme researcher, [employer]  Tobacco Use  . Smoking status: Former Games developermoker  . Smokeless tobacco: Never Used  Substance and Sexual Activity  . Alcohol use: Yes    Alcohol/week: 3.0 oz    Types: 5 Glasses of wine per week  . Drug use: No  . Sexual activity: Not Currently    Partners: Male  Other Topics Concern  . None  Social History Narrative   Lives with husband   Caffeine use:  3 cups per day (coffee), 1 cup tea per day   Right-handed   Family History: Family History  Problem Relation Age of Onset  . Cancer Father   . Breast cancer Sister   . Heart disease  Paternal Uncle   . Lung cancer Maternal Grandfather    Allergies: No Known Allergies Medications: See med rec.  Review of Systems: No fevers, chills, night sweats, weight loss, chest pain, or shortness of breath.   Objective:    General: Well Developed, well nourished, and in no acute distress.  Neuro: Alert and oriented x3, extra-ocular muscles intact, sensation grossly intact.  HEENT: Normocephalic, atraumatic, pupils equal round reactive to light, neck supple, no masses, no lymphadenopathy, thyroid nonpalpable.  Oropharynx, nasopharynx unremarkable, right tympanic membrane is obscured with impacted cerumen. Skin: Warm and dry, no rashes. Cardiac: Regular rate and rhythm, no murmurs rubs or gallops, no lower extremity edema.  Respiratory: Clear to auscultation bilaterally. Not using accessory muscles, speaking in full sentences.  Indication: Cerumen impaction of the ear(s) Medical necessity statement: On physical examination, cerumen impairs clinically significant portions of the external auditory canal, and tympanic membrane. Noted obstructive, copious cerumen that cannot be removed without magnification and instrumentations requiring physician skills Consent: Discussed benefits and risks of procedure and verbal consent obtained Procedure: Patient was prepped for the procedure. Utilized an otoscope to assess and take note of the ear canal, the tympanic membrane, and the presence, amount, and placement of the cerumen. Gentle water irrigation was utilized to remove cerumen.  Post procedure examination: shows cerumen was completely removed. Patient tolerated procedure well. The patient is made aware that  they may experience temporary vertigo, temporary hearing loss, and temporary discomfort. If these symptom last for more than 24 hours to call the clinic or proceed to the ED.  Impression and Recommendations:    Hearing loss due to cerumen impaction, right Unable to get the cerumen out with  instrumentation, aggressive irrigation today.  I spent 25 minutes with this patient, greater than 50% was face-to-face time counseling regarding the above diagnoses, this was separate from the time spent performing the above procedure ___________________________________________ Ihor Austin. Benjamin Stain, M.D., ABFM., CAQSM. Primary Care and Sports Medicine [facility]  Adjunct Instructor of Family Medicine  [employer]

## 2017-10-23 NOTE — Assessment & Plan Note (Signed)
Unable to get the cerumen out with instrumentation, aggressive irrigation today.

## 2017-11-22 ENCOUNTER — Other Ambulatory Visit: Payer: Self-pay | Admitting: Sports Medicine

## 2017-11-22 DIAGNOSIS — M810 Age-related osteoporosis without current pathological fracture: Secondary | ICD-10-CM

## 2017-11-28 ENCOUNTER — Other Ambulatory Visit: Payer: Self-pay | Admitting: Sports Medicine

## 2017-11-28 DIAGNOSIS — E785 Hyperlipidemia, unspecified: Secondary | ICD-10-CM

## 2017-12-08 ENCOUNTER — Other Ambulatory Visit: Payer: Self-pay | Admitting: Sports Medicine

## 2017-12-08 DIAGNOSIS — R4702 Dysphasia: Secondary | ICD-10-CM

## 2017-12-25 ENCOUNTER — Other Ambulatory Visit: Payer: Self-pay | Admitting: Sports Medicine

## 2017-12-25 DIAGNOSIS — M47812 Spondylosis without myelopathy or radiculopathy, cervical region: Secondary | ICD-10-CM

## 2017-12-30 ENCOUNTER — Other Ambulatory Visit: Payer: Self-pay | Admitting: Neurology

## 2017-12-31 NOTE — Telephone Encounter (Signed)
Pt needs an appt for further  refills 

## 2018-01-11 ENCOUNTER — Encounter: Payer: Self-pay | Admitting: Sports Medicine

## 2018-01-11 ENCOUNTER — Ambulatory Visit (INDEPENDENT_AMBULATORY_CARE_PROVIDER_SITE_OTHER): Payer: BLUE CROSS/BLUE SHIELD | Admitting: Sports Medicine

## 2018-01-11 VITALS — BP 128/69 | HR 70 | Resp 16 | Wt 115.0 lb

## 2018-01-11 DIAGNOSIS — Z0184 Encounter for antibody response examination: Secondary | ICD-10-CM | POA: Diagnosis not present

## 2018-01-11 DIAGNOSIS — Z23 Encounter for immunization: Secondary | ICD-10-CM | POA: Diagnosis not present

## 2018-01-11 MED ORDER — ZOSTER VAC RECOMB ADJUVANTED 50 MCG/0.5ML IM SUSR: INTRAMUSCULAR | 0 refills | Status: DC

## 2018-01-11 NOTE — Progress Notes (Signed)
Subjective:    CC: Concerns  HPI: This is a pleasant 72 year old female, she has some concerns regarding the recent resurgence of measles.  She and her husband believe they have both been vaccinated his children but are wondering if they need an updated vaccine.  No symptoms.  I reviewed the past medical history, family history, social history, surgical history, and allergies today and no changes were needed.  Please see the problem list section below in epic for further details.  Past Medical History: Past Medical History:  Diagnosis Date  . High cholesterol   . Hypertension   . Stroke [facility])    3   Past Surgical History: Past Surgical History:  Procedure Laterality Date  . PLACEMENT OF BREAST IMPLANTS  1970's   Social History: Social History   Socioeconomic History  . Marital status: Married    Spouse name: Jonny Ruiz  . Number of children: Not on file  . Years of education: 1 yr college  . Highest education level: Not on file  Occupational History  . Occupation: Oncologist  . Financial resource strain: Not on file  . Food insecurity:    Worry: Not on file    Inability: Not on file  . Transportation needs:    Medical: Not on file    Non-medical: Not on file  Tobacco Use  . Smoking status: Former Games developer  . Smokeless tobacco: Never Used  Substance and Sexual Activity  . Alcohol use: Yes    Alcohol/week: 3.0 oz    Types: 5 Glasses of wine per week  . Drug use: No  . Sexual activity: Not Currently    Partners: Male  Lifestyle  . Physical activity:    Days per week: Not on file    Minutes per session: Not on file  . Stress: Not on file  Relationships  . Social connections:    Talks on phone: Not on file    Gets together: Not on file    Attends religious service: Not on file    Active member of club or organization: Not on file    Attends meetings of clubs or organizations: Not on file    Relationship status: Not on file  Other Topics  Concern  . Not on file  Social History Narrative   Lives with husband   Caffeine use:  3 cups per day (coffee), 1 cup tea per day   Right-handed   Family History: Family History  Problem Relation Age of Onset  . Cancer Father   . Breast cancer Sister   . Heart disease Paternal Uncle   . Lung cancer Maternal Grandfather    Allergies: No Known Allergies Medications: See med rec.  Review of Systems: No fevers, chills, night sweats, weight loss, chest pain, or shortness of breath.   Objective:    General: Well Developed, well nourished, and in no acute distress.  Neuro: Alert and oriented x3, extra-ocular muscles intact, sensation grossly intact.  HEENT: Normocephalic, atraumatic, pupils equal round reactive to light, neck supple, no masses, no lymphadenopathy, thyroid nonpalpable.  Skin: Warm and dry, no rashes. Cardiac: Regular rate and rhythm, no murmurs rubs or gallops, no lower extremity edema.  Respiratory: Clear to auscultation bilaterally. Not using accessory muscles, speaking in full sentences.  Impression and Recommendations:    Immunity status testing Due to recent measles outbreak patient would like testing for measles immunity. I think this is fine. In the meantime were also going to give her her  pneumonia 23, I will send a prescription for Shingrix to the pharmacy. We have no Shingrix or Zostavax here. ___________________________________________ Ihor Austinhomas J. Benjamin Stainhekkekandam, M.D., ABFM., CAQSM. Primary Care and Sports Medicine [facility]  Adjunct Instructor of Family Medicine  [employer]

## 2018-01-11 NOTE — Assessment & Plan Note (Signed)
Due to recent measles outbreak patient would like testing for measles immunity. I think this is fine. In the meantime were also going to give her her pneumonia 7523, I will send a prescription for Shingrix to the pharmacy. We have no Shingrix or Zostavax here.

## 2018-01-11 NOTE — Addendum Note (Signed)
Addended by: Baird KayUGLAS, Tamecia Mcdougald M on: 01/11/2018 03:24 PM   Modules accepted: Orders

## 2018-01-14 LAB — MEASLES/MUMPS/RUBELLA IMMUNITY
Mumps IgG: 178 AU/mL
Rubella: >33 {index}
Rubeola IgG: >300 [AU]/ml

## 2018-01-28 ENCOUNTER — Other Ambulatory Visit: Payer: Self-pay | Admitting: Neurology

## 2018-01-31 ENCOUNTER — Encounter: Payer: BLUE CROSS/BLUE SHIELD | Admitting: Sports Medicine

## 2018-02-23 ENCOUNTER — Other Ambulatory Visit: Payer: Self-pay | Admitting: Neurology

## 2018-09-24 ENCOUNTER — Other Ambulatory Visit: Payer: Self-pay | Admitting: Sports Medicine

## 2018-09-24 DIAGNOSIS — M810 Age-related osteoporosis without current pathological fracture: Secondary | ICD-10-CM

## 2018-10-01 ENCOUNTER — Ambulatory Visit (INDEPENDENT_AMBULATORY_CARE_PROVIDER_SITE_OTHER): Payer: BLUE CROSS/BLUE SHIELD | Admitting: Sports Medicine

## 2018-10-01 VITALS — BP 148/92 | HR 101 | Ht 62.5 in | Wt 111.0 lb

## 2018-10-01 DIAGNOSIS — Z0000 Encounter for general adult medical examination without abnormal findings: Secondary | ICD-10-CM

## 2018-10-01 DIAGNOSIS — Z23 Encounter for immunization: Secondary | ICD-10-CM | POA: Diagnosis not present

## 2018-10-01 DIAGNOSIS — I639 Cerebral infarction, unspecified: Secondary | ICD-10-CM | POA: Diagnosis not present

## 2018-10-01 DIAGNOSIS — I10 Essential (primary) hypertension: Secondary | ICD-10-CM

## 2018-10-01 DIAGNOSIS — E785 Hyperlipidemia, unspecified: Secondary | ICD-10-CM

## 2018-10-01 DIAGNOSIS — M810 Age-related osteoporosis without current pathological fracture: Secondary | ICD-10-CM

## 2018-10-01 MED ORDER — AMLODIPINE BESYLATE 10 MG PO TABS: 10.0000 mg | ORAL_TABLET | Freq: Every day | ORAL | 3 refills | Status: DC

## 2018-10-01 MED ORDER — LISINOPRIL-HYDROCHLOROTHIAZIDE 20-25 MG PO TABS: 1.0000 | ORAL_TABLET | Freq: Every day | ORAL | 3 refills | Status: DC

## 2018-10-01 MED ORDER — ALENDRONATE SODIUM 70 MG PO TABS: 70.0000 mg | ORAL_TABLET | ORAL | 3 refills | Status: DC

## 2018-10-01 MED ORDER — CLOPIDOGREL BISULFATE 75 MG PO TABS: 75.0000 mg | ORAL_TABLET | Freq: Every day | ORAL | 3 refills | Status: DC

## 2018-10-01 NOTE — Assessment & Plan Note (Signed)
Continue Fosamax  

## 2018-10-01 NOTE — Assessment & Plan Note (Signed)
Uncontrolled but noncompliant withLisinopril HCTZ and amlodipine, refilling these medications. Return in 2 weeks for nurse visit blood pressure check to ensure compliance.

## 2018-10-01 NOTE — Progress Notes (Signed)
Subjective:    CC: Annual physical  HPI:  Dawn Huerta is here for her physical, she did have a right thalamic stroke with persistent dysarthria.  Unfortunately has she has been fairly noncompliant with her Plavix as well as her blood pressure medications.  Hypertension: Uncontrolled.  CVA: No further symptoms.  Hyperlipidemia: Stable on atorvastatin.  Osteoporosis: Stable on Fosamax.  I reviewed the past medical history, family history, social history, surgical history, and allergies today and no changes were needed.  Please see the problem list section below in epic for further details.  Past Medical History: Past Medical History:  Diagnosis Date  . High cholesterol   . Hypertension   . Stroke [facility])    3   Past Surgical History: Past Surgical History:  Procedure Laterality Date  . PLACEMENT OF BREAST IMPLANTS  1970's   Social History: Social History   Socioeconomic History  . Marital status: Married    Spouse name: Jonny Ruiz  . Number of children: Not on file  . Years of education: 1 yr college  . Highest education level: Not on file  Occupational History  . Occupation: Oncologist  . Financial resource strain: Not on file  . Food insecurity:    Worry: Not on file    Inability: Not on file  . Transportation needs:    Medical: Not on file    Non-medical: Not on file  Tobacco Use  . Smoking status: Former Games developer  . Smokeless tobacco: Never Used  Substance and Sexual Activity  . Alcohol use: Yes    Alcohol/week: 5.0 standard drinks    Types: 5 Glasses of wine per week  . Drug use: No  . Sexual activity: Not Currently    Partners: Male  Lifestyle  . Physical activity:    Days per week: Not on file    Minutes per session: Not on file  . Stress: Not on file  Relationships  . Social connections:    Talks on phone: Not on file    Gets together: Not on file    Attends religious service: Not on file    Active member of club or organization:  Not on file    Attends meetings of clubs or organizations: Not on file    Relationship status: Not on file  Other Topics Concern  . Not on file  Social History Narrative   Lives with husband   Caffeine use:  3 cups per day (coffee), 1 cup tea per day   Right-handed   Family History: Family History  Problem Relation Age of Onset  . Cancer Father   . Breast cancer Sister   . Heart disease Paternal Uncle   . Lung cancer Maternal Grandfather    Allergies: No Known Allergies Medications: See med rec.  Review of Systems: No headache, visual changes, nausea, vomiting, diarrhea, constipation, dizziness, abdominal pain, skin rash, fevers, chills, night sweats, swollen lymph nodes, weight loss, chest pain, body aches, joint swelling, muscle aches, shortness of breath, mood changes, visual or auditory hallucinations.  Objective:    General: Well Developed, well nourished, and in no acute distress.  Neuro: Alert and oriented x3, extra-ocular muscles intact, sensation grossly intact. Cranial nerves II through XII are intact, motor, sensory, and coordinative functions are all intact. HEENT: Normocephalic, atraumatic, pupils equal round reactive to light, neck supple, no masses, no lymphadenopathy, thyroid nonpalpable. Oropharynx, nasopharynx, external ear canals are unremarkable. Skin: Warm and dry, no rashes noted.  Cardiac: Regular rate and  rhythm, no murmurs rubs or gallops.  Respiratory: Clear to auscultation bilaterally. Not using accessory muscles, speaking in full sentences.  Abdominal: Soft, nontender, nondistended, positive bowel sounds, no masses, no organomegaly.  Musculoskeletal: Shoulder, elbow, wrist, hip, knee, ankle stable, and with full range of motion.  Impression and Recommendations:    The patient was counselled, risk factors were discussed, anticipatory guidance given.  Annual physical exam Routine physical as above.  Flu shot today.  Benign essential  hypertension Uncontrolled but noncompliant withLisinopril HCTZ and amlodipine, refilling these medications. Return in 2 weeks for nurse visit blood pressure check to ensure compliance.  CVA (cerebral vascular accident) (HCC) Unfortunately has been noncompliant with her blood pressure medications. She has already had a CVA now with permanent mild dysarthria. We will control her blood pressure again. She did have a subacute right thalamic infarct with multiple areas of brain tissue loss. Carotid Doppler showed nonobstructive atherosclerosis, echocardiogram unremarkable, 30-day Holter monitor unremarkable. Restarting Plavix.  Osteoporosis Continue Fosamax.  Hyperlipidemia LDL goal <100 Rechecking lipids. ___________________________________________ Ihor Austin. Benjamin Stain, M.D., ABFM., CAQSM. Primary Care and Sports Medicine [facility]  Adjunct Professor of Family Medicine  [employer]

## 2018-10-01 NOTE — Assessment & Plan Note (Signed)
Unfortunately has been noncompliant with her blood pressure medications. She has already had a CVA now with permanent mild dysarthria. We will control her blood pressure again. She did have a subacute right thalamic infarct with multiple areas of brain tissue loss. Carotid Doppler showed nonobstructive atherosclerosis, echocardiogram unremarkable, 30-day Holter monitor unremarkable. Restarting Plavix.

## 2018-10-01 NOTE — Assessment & Plan Note (Signed)
Routine physical as above.  Flu shot today.

## 2018-10-01 NOTE — Assessment & Plan Note (Signed)
Rechecking lipids. 

## 2018-10-15 ENCOUNTER — Ambulatory Visit (INDEPENDENT_AMBULATORY_CARE_PROVIDER_SITE_OTHER): Payer: BLUE CROSS/BLUE SHIELD | Admitting: Sports Medicine

## 2018-10-15 VITALS — BP 148/82 | HR 92 | Temp 98.6°F | Wt 111.0 lb

## 2018-10-15 DIAGNOSIS — I10 Essential (primary) hypertension: Secondary | ICD-10-CM

## 2018-10-15 NOTE — Progress Notes (Signed)
Pt in today for BP check. At her last visit she had not been compliant with her BP medications. Her BP was 148/92 pulse 101. Dr. Benjamin Stain instructed patient to start taking her amlodipine and her prinzide 20-25 daily and return for BP check. Pt reports taking only amlodipine her BP today was 157/84 p 98, after resting 10 minutes  Her bp was 148/82. Spoke with provider, he stated she was supposed to be taking both medications. The risk and benefits of being complaint were discussed with patient. Pt stated she would start taking both medications daily and return in 2 weeks for another BP check.

## 2018-10-16 LAB — CBC
HCT: 39.5 % (ref 35.0–45.0)
Hemoglobin: 13.8 g/dL (ref 11.7–15.5)
MCH: 32.8 pg (ref 27.0–33.0)
MCHC: 34.9 g/dL (ref 32.0–36.0)
MCV: 93.8 fL (ref 80.0–100.0)
MPV: 11.7 fL (ref 7.5–12.5)
Platelets: 322 10*3/uL (ref 140–400)
RBC: 4.21 10*6/uL (ref 3.80–5.10)
RDW: 12.8 % (ref 11.0–15.0)
WBC: 6.6 10*3/uL (ref 3.8–10.8)

## 2018-10-16 LAB — COMPREHENSIVE METABOLIC PANEL
AG Ratio: 1.5 (calc) (ref 1.0–2.5)
AST: 23 U/L (ref 10–35)
Albumin: 5 g/dL (ref 3.6–5.1)
Alkaline phosphatase (APISO): 51 U/L (ref 33–130)
BUN: 11 mg/dL (ref 7–25)
Chloride: 101 mmol/L (ref 98–110)
Creat: 0.71 mg/dL (ref 0.60–0.93)
Globulin: 3.3 g/dL (calc) (ref 1.9–3.7)
Glucose, Bld: 110 mg/dL — ABNORMAL HIGH (ref 65–99)
Potassium: 4.4 mmol/L (ref 3.5–5.3)
Sodium: 138 mmol/L (ref 135–146)
Total Bilirubin: 0.6 mg/dL (ref 0.2–1.2)
Total Protein: 8.3 g/dL — ABNORMAL HIGH (ref 6.1–8.1)

## 2018-10-16 LAB — LIPID PANEL W/REFLEX DIRECT LDL
Cholesterol: 206 mg/dL — ABNORMAL HIGH (ref <200)
HDL: 99 mg/dL (ref >50)
LDL Cholesterol (Calc): 91 mg/dL (calc)
Non-HDL Cholesterol (Calc): 107 mg/dL (calc) (ref <130)
Total CHOL/HDL Ratio: 2.1 (calc) (ref <5.0)
Triglycerides: 69 mg/dL (ref <150)

## 2018-10-16 LAB — COMPREHENSIVE METABOLIC PANEL WITH GFR
ALT: 23 U/L (ref 6–29)
CO2: 25 mmol/L (ref 20–32)
Calcium: 10.1 mg/dL (ref 8.6–10.4)

## 2018-10-16 LAB — TSH: TSH: 1.51 mIU/L (ref 0.40–4.50)

## 2018-10-19 ENCOUNTER — Emergency Department (INDEPENDENT_AMBULATORY_CARE_PROVIDER_SITE_OTHER): Payer: BLUE CROSS/BLUE SHIELD

## 2018-10-19 ENCOUNTER — Other Ambulatory Visit: Payer: Self-pay

## 2018-10-19 ENCOUNTER — Emergency Department (INDEPENDENT_AMBULATORY_CARE_PROVIDER_SITE_OTHER)
Admission: EM | Admit: 2018-10-19 | Discharge: 2018-10-19 | Disposition: A | Discharge status: Home / Self Care | Payer: BLUE CROSS/BLUE SHIELD | Source: Home / Self Care

## 2018-10-19 ENCOUNTER — Encounter: Payer: Self-pay | Admitting: Family Medicine

## 2018-10-19 DIAGNOSIS — S63632A Sprain of interphalangeal joint of right middle finger, initial encounter: Secondary | ICD-10-CM | POA: Diagnosis not present

## 2018-10-19 DIAGNOSIS — M25562 Pain in left knee: Secondary | ICD-10-CM | POA: Diagnosis not present

## 2018-10-19 DIAGNOSIS — M20031 Swan-neck deformity of right finger(s): Secondary | ICD-10-CM | POA: Diagnosis not present

## 2018-10-19 DIAGNOSIS — S0083XA Contusion of other part of head, initial encounter: Secondary | ICD-10-CM

## 2018-10-19 DIAGNOSIS — S8002XA Contusion of left knee, initial encounter: Secondary | ICD-10-CM | POA: Diagnosis not present

## 2018-10-19 DIAGNOSIS — S61212A Laceration without foreign body of right middle finger without damage to nail, initial encounter: Secondary | ICD-10-CM

## 2018-10-19 DIAGNOSIS — S01511A Laceration without foreign body of lip, initial encounter: Secondary | ICD-10-CM | POA: Diagnosis not present

## 2018-10-19 MED ORDER — HYDROCODONE-ACETAMINOPHEN 5-325 MG PO TABS: 1.0000 | ORAL_TABLET | Freq: Four times a day (QID) | ORAL | 0 refills | Status: DC | PRN

## 2018-10-19 NOTE — ED Triage Notes (Signed)
Patient fell in a parking lot yesterday; injured left eye, left lower lip, knocked a tooth out and has other that feels loose, left knee pain and laceration on right hand. Has not taken OTCs for pain because she is afraid she will swallow a tooth.

## 2018-10-19 NOTE — ED Provider Notes (Signed)
[facility]    CSN: [id number] Arrival date & time: 10/19/18  1051     History   Chief Complaint Chief Complaint  Patient presents with  . Fall    HPI Dawn Huerta is a 73 y.o. female.   First [place] urgent care visit in over 3 years for this 73 year old woman.  She fell yesterday in the parking lot dislodging several teeth, striking the left side of her face, striking her right middle finger, lacerating her lips, and injuring her left knee.  There was no loss of consciousness and patient has had a mild headache from time to time since.     Past Medical History:  Diagnosis Date  . High cholesterol   . Hypertension   . Stroke [facility])    3    Patient Active Problem List   Diagnosis Date Noted  . Immunity status testing 01/11/2018  . Hearing loss due to cerumen impaction, right 10/23/2017  . Allergic rhinitis 08/02/2017  . Osteoporosis 12/26/2016  . Cervical spondylosis 12/12/2016  . Hyperlipidemia LDL goal <100 12/03/2016  . Annual physical exam 12/01/2016  . CVA (cerebral vascular accident) (HCC) 12/01/2016  . Benign essential hypertension 12/01/2016    Past Surgical History:  Procedure Laterality Date  . PLACEMENT OF BREAST IMPLANTS  1970's    OB History   No obstetric history on file.      Home Medications    Prior to Admission medications   Medication Sig Start Date End Date Taking? Authorizing Provider  acetaminophen (TYLENOL) 650 MG CR tablet Take 1 tablet (650 mg total) every 8 (eight) hours as needed by mouth for pain. 08/02/17   Monica Becton, MD  alendronate (FOSAMAX) 70 MG tablet Take 1 tablet (70 mg total) by mouth every 7 (seven) days. 10/01/18   Monica Becton, MD  amLODipine (NORVASC) 10 MG tablet Take 1 tablet (10 mg total) by mouth daily. 10/01/18   Monica Becton, MD  atorvastatin (LIPITOR) 20 MG tablet TAKE 1 TABLET (20 MG TOTAL) BY MOUTH DAILY. 11/28/17   Monica Becton, MD  clopidogrel  (PLAVIX) 75 MG tablet Take 1 tablet (75 mg total) by mouth daily. 10/01/18   Monica Becton, MD  Glucosamine HCl (GLUCOSAMINE PO) Take by mouth daily.    [provider]  HYDROcodone-acetaminophen (NORCO) 5-325 MG tablet Take 1 tablet by mouth every 6 (six) hours as needed for moderate pain. 10/19/18   Elvina Sidle, MD  lisinopril-hydrochlorothiazide (PRINZIDE,ZESTORETIC) 20-25 MG tablet Take 1 tablet by mouth daily. 10/01/18   Monica Becton, MD  Multiple Vitamin (MULTIVITAMIN) tablet Take 1 tablet by mouth daily.    [provider]  TYLENOL 8 HOUR 650 MG CR tablet TAKE 1 TABLET (650 MG TOTAL) BY MOUTH EVERY 8 (EIGHT) HOURS AS NEEDED FOR PAIN. 12/25/17   Monica Becton, MD    Family History Family History  Problem Relation Age of Onset  . Cancer Father   . Breast cancer Sister   . Heart disease Paternal Uncle   . Lung cancer Maternal Grandfather     Social History Social History   Tobacco Use  . Smoking status: Former Games developer  . Smokeless tobacco: Never Used  Substance Use Topics  . Alcohol use: Yes    Alcohol/week: 5.0 standard drinks    Types: 5 Glasses of wine per week  . Drug use: No     Allergies   Patient has no known allergies.   Review of Systems Review  of Systems   Physical Exam Triage Vital Signs ED Triage Vitals  Enc Vitals Group     BP      Pulse      Resp      Temp      Temp src      SpO2      Weight      Height      Head Circumference      Peak Flow      Pain Score      Pain Loc      Pain Edu?      Excl. in GC?    No data found.  Updated Vital Signs BP 132/80 (BP Location: Right Arm)   Pulse (!) 105   Temp 99 F (37.2 C) (Oral)   Resp 18   Ht 5' 2.5" (1.588 m)   Wt 49.9 kg   SpO2 100%   BMI 19.80 kg/m    Physical Exam Vitals signs and nursing note reviewed.  Constitutional:      General: She is not in acute distress.    Appearance: She is normal weight.  HENT:     Head:     Comments:  Left periorbital ecchymosis    Right Ear: Tympanic membrane normal.     Left Ear: Tympanic membrane normal.     Nose: Nose normal.     Mouth/Throat:     Mouth: Mucous membranes are moist.     Comments: Upper and lower lip laceratons Loose teeth upper left Eyes:     Conjunctiva/sclera: Conjunctivae normal.  Neck:     Musculoskeletal: Normal range of motion and neck supple.  Cardiovascular:     Rate and Rhythm: Normal rate.  Pulmonary:     Effort: Pulmonary effort is normal.  Musculoskeletal:     Comments: Tender swollen left middle finger PIP joint Tender left suprapatellar area of left knee  Skin:    Comments: Lip lacerations (see photo)  Neurological:     General: No focal deficit present.     Mental Status: She is alert.  Psychiatric:        Mood and Affect: Mood normal.   middle finger laceration dermabonded after washing with soap and water.   UC Treatments / Results  Labs (all labs ordered are listed, but only abnormal results are displayed) Labs Reviewed - No data to display  EKG None  Radiology No results found.  Procedures Procedures (including critical care time)  Medications Ordered in UC Medications - No data to display  Initial Impression / Assessment and Plan / UC Course  I have reviewed the triage vital signs and the nursing notes.  Pertinent labs & imaging results that were available during my care of the patient were reviewed by me and considered in my medical decision making (see chart for details).    Final Clinical Impressions(s) / UC Diagnoses   Final diagnoses:  Contusion of face, initial encounter  Lip laceration, initial encounter  Sprain of interphalangeal joint of right middle finger, initial encounter  Contusion of left knee, initial encounter  Laceration of right middle finger without foreign body without damage to nail, initial encounter     Discharge Instructions     Please call your dentist for advice on the loose teeth.   Stay on liquid diet this weekend.  The lip lacerations will heal on their own.  Your x-rays of finger and knee show no fractures.    ED Prescriptions    Medication  Sig Dispense Auth. Provider   HYDROcodone-acetaminophen (NORCO) 5-325 MG tablet Take 1 tablet by mouth every 6 (six) hours as needed for moderate pain. 12 tablet Elvina SidleLauenstein, Jayde Mcallister, MD     Controlled Substance Prescriptions [place] Controlled Substance Registry consulted? Not Applicable   Elvina SidleLauenstein, Shonta Bourque, MD 10/19/18 1218

## 2018-10-19 NOTE — Discharge Instructions (Addendum)
Please call your dentist for advice on the loose teeth.  Stay on liquid diet this weekend.  The lip lacerations will heal on their own.  Your x-rays of finger and knee show no fractures.

## 2018-10-22 ENCOUNTER — Telehealth: Payer: Self-pay

## 2018-10-22 NOTE — Telephone Encounter (Signed)
Left voice message inquiring about patients status. Encouraged patient to call with questions or concerns.  

## 2018-10-29 ENCOUNTER — Ambulatory Visit: Payer: BLUE CROSS/BLUE SHIELD

## 2018-10-30 ENCOUNTER — Ambulatory Visit (INDEPENDENT_AMBULATORY_CARE_PROVIDER_SITE_OTHER): Payer: BLUE CROSS/BLUE SHIELD | Admitting: Osteopathic Medicine

## 2018-10-30 VITALS — BP 137/75 | HR 82 | Temp 97.6°F | Wt 109.0 lb

## 2018-10-30 DIAGNOSIS — I10 Essential (primary) hypertension: Secondary | ICD-10-CM | POA: Diagnosis not present

## 2018-10-30 NOTE — Progress Notes (Signed)
Preeeesh ya'lllllll

## 2018-10-30 NOTE — Progress Notes (Signed)
Agree w/ nurse note, will route to PCP as FYI if he has anything to add

## 2018-10-30 NOTE — Progress Notes (Signed)
Pt in today for BP check, her BP at her last visit was 157/84 pulse 98. Pt was advised to take both BP meds and to return in 2 weeks. Pt states she has been taking both BP meds and is feeling good. Today's BP was 137/75 pulse 82. Advised patient to continue taking her current medication regiment and to schedule BP check in 5 months with Dr. Karie Schwalbe. Pt was also advised to keep a BP log to bring to her visit and to return sooner if her BP is not controlled with current medications.

## 2018-11-19 ENCOUNTER — Other Ambulatory Visit: Payer: Self-pay | Admitting: Sports Medicine

## 2018-11-19 DIAGNOSIS — E785 Hyperlipidemia, unspecified: Secondary | ICD-10-CM

## 2018-12-03 ENCOUNTER — Other Ambulatory Visit: Payer: Self-pay | Admitting: Sports Medicine

## 2018-12-03 DIAGNOSIS — R4702 Dysphasia: Secondary | ICD-10-CM

## 2019-02-17 ENCOUNTER — Ambulatory Visit: Payer: BLUE CROSS/BLUE SHIELD | Admitting: Sports Medicine

## 2019-05-21 ENCOUNTER — Ambulatory Visit (INDEPENDENT_AMBULATORY_CARE_PROVIDER_SITE_OTHER): Payer: BC Managed Care – PPO | Admitting: Sports Medicine

## 2019-05-21 ENCOUNTER — Other Ambulatory Visit: Payer: Self-pay

## 2019-05-21 DIAGNOSIS — Z23 Encounter for immunization: Secondary | ICD-10-CM | POA: Diagnosis not present

## 2019-09-05 ENCOUNTER — Other Ambulatory Visit: Payer: Self-pay | Admitting: Sports Medicine

## 2019-09-05 DIAGNOSIS — I10 Essential (primary) hypertension: Secondary | ICD-10-CM

## 2019-09-23 ENCOUNTER — Other Ambulatory Visit: Payer: Self-pay | Admitting: Sports Medicine

## 2019-09-23 DIAGNOSIS — E785 Hyperlipidemia, unspecified: Secondary | ICD-10-CM

## 2019-09-23 DIAGNOSIS — M810 Age-related osteoporosis without current pathological fracture: Secondary | ICD-10-CM

## 2019-10-04 ENCOUNTER — Other Ambulatory Visit: Payer: Self-pay | Admitting: Sports Medicine

## 2019-10-04 DIAGNOSIS — I10 Essential (primary) hypertension: Secondary | ICD-10-CM

## 2019-10-31 ENCOUNTER — Other Ambulatory Visit: Payer: Self-pay | Admitting: Sports Medicine

## 2019-10-31 DIAGNOSIS — I10 Essential (primary) hypertension: Secondary | ICD-10-CM

## 2019-11-07 ENCOUNTER — Other Ambulatory Visit: Payer: Self-pay | Admitting: Sports Medicine

## 2019-11-07 DIAGNOSIS — M47812 Spondylosis without myelopathy or radiculopathy, cervical region: Secondary | ICD-10-CM

## 2019-11-07 MED ORDER — ACETAMINOPHEN ER 650 MG PO TBCR: 650.0000 mg | EXTENDED_RELEASE_TABLET | Freq: Three times a day (TID) | ORAL | 3 refills | Status: DC | PRN

## 2019-12-14 ENCOUNTER — Other Ambulatory Visit: Payer: Self-pay | Admitting: Sports Medicine

## 2020-02-14 ENCOUNTER — Encounter: Payer: Self-pay | Admitting: Sports Medicine

## 2020-04-24 ENCOUNTER — Other Ambulatory Visit: Payer: Self-pay | Admitting: Sports Medicine

## 2020-04-24 DIAGNOSIS — I10 Essential (primary) hypertension: Secondary | ICD-10-CM

## 2020-06-24 ENCOUNTER — Other Ambulatory Visit: Payer: Self-pay | Admitting: Sports Medicine

## 2020-06-24 DIAGNOSIS — M810 Age-related osteoporosis without current pathological fracture: Secondary | ICD-10-CM

## 2020-09-18 ENCOUNTER — Other Ambulatory Visit: Payer: Self-pay | Admitting: Sports Medicine

## 2020-09-18 DIAGNOSIS — I10 Essential (primary) hypertension: Secondary | ICD-10-CM

## 2020-09-30 ENCOUNTER — Other Ambulatory Visit: Payer: Self-pay | Admitting: Sports Medicine

## 2020-09-30 DIAGNOSIS — E785 Hyperlipidemia, unspecified: Secondary | ICD-10-CM

## 2021-07-11 ENCOUNTER — Ambulatory Visit: Payer: BC Managed Care – PPO | Admitting: Sports Medicine

## 2021-07-11 ENCOUNTER — Telehealth: Payer: Self-pay | Admitting: Sports Medicine

## 2021-07-11 ENCOUNTER — Other Ambulatory Visit: Payer: Self-pay

## 2021-07-11 DIAGNOSIS — E785 Hyperlipidemia, unspecified: Secondary | ICD-10-CM

## 2021-07-11 DIAGNOSIS — I10 Essential (primary) hypertension: Secondary | ICD-10-CM

## 2021-07-11 DIAGNOSIS — M810 Age-related osteoporosis without current pathological fracture: Secondary | ICD-10-CM

## 2021-07-11 MED ORDER — ALENDRONATE SODIUM 70 MG PO TABS: 70.0000 mg | ORAL_TABLET | ORAL | 0 refills | Status: DC

## 2021-07-11 MED ORDER — CLOPIDOGREL BISULFATE 75 MG PO TABS: 75.0000 mg | ORAL_TABLET | Freq: Every day | ORAL | 0 refills | Status: DC

## 2021-07-11 MED ORDER — AMLODIPINE BESYLATE 10 MG PO TABS: 10.0000 mg | ORAL_TABLET | Freq: Every day | ORAL | 0 refills | Status: DC

## 2021-07-11 MED ORDER — ATORVASTATIN CALCIUM 20 MG PO TABS: 20.0000 mg | ORAL_TABLET | Freq: Every day | ORAL | 0 refills | Status: DC

## 2021-07-11 NOTE — Telephone Encounter (Signed)
Patient hasd to be put back on the schedule but her needs her

## 2021-07-11 NOTE — Telephone Encounter (Signed)
Pt had to be put back on the schedule to see Dr.T but she is needing some meds refilled before her appt time... Clopidogrel 75 mg Tab Amlodipine 10mg  Atorvastatin 20mg  Alendronate 70 mg

## 2021-07-15 ENCOUNTER — Other Ambulatory Visit: Payer: Self-pay

## 2021-07-15 ENCOUNTER — Ambulatory Visit (INDEPENDENT_AMBULATORY_CARE_PROVIDER_SITE_OTHER): Payer: Medicare Other

## 2021-07-15 ENCOUNTER — Ambulatory Visit (INDEPENDENT_AMBULATORY_CARE_PROVIDER_SITE_OTHER): Payer: Medicare Other | Admitting: Sports Medicine

## 2021-07-15 ENCOUNTER — Encounter: Payer: Self-pay | Admitting: Sports Medicine

## 2021-07-15 VITALS — BP 189/128 | HR 92 | Ht 62.5 in | Wt 101.0 lb

## 2021-07-15 DIAGNOSIS — H02401 Unspecified ptosis of right eyelid: Secondary | ICD-10-CM

## 2021-07-15 DIAGNOSIS — M25512 Pain in left shoulder: Secondary | ICD-10-CM

## 2021-07-15 DIAGNOSIS — R1310 Dysphagia, unspecified: Secondary | ICD-10-CM | POA: Diagnosis not present

## 2021-07-15 DIAGNOSIS — I10 Essential (primary) hypertension: Secondary | ICD-10-CM

## 2021-07-15 DIAGNOSIS — Z23 Encounter for immunization: Secondary | ICD-10-CM | POA: Diagnosis not present

## 2021-07-15 DIAGNOSIS — M810 Age-related osteoporosis without current pathological fracture: Secondary | ICD-10-CM

## 2021-07-15 DIAGNOSIS — G8929 Other chronic pain: Secondary | ICD-10-CM

## 2021-07-15 DIAGNOSIS — H02409 Unspecified ptosis of unspecified eyelid: Secondary | ICD-10-CM | POA: Insufficient documentation

## 2021-07-15 DIAGNOSIS — Z0000 Encounter for general adult medical examination without abnormal findings: Secondary | ICD-10-CM

## 2021-07-15 DIAGNOSIS — R627 Adult failure to thrive: Secondary | ICD-10-CM | POA: Diagnosis not present

## 2021-07-15 DIAGNOSIS — R011 Cardiac murmur, unspecified: Secondary | ICD-10-CM

## 2021-07-15 MED ORDER — SHINGRIX 50 MCG/0.5ML IM SUSR: 0.5000 mL | Freq: Once | INTRAMUSCULAR | 0 refills | Status: AC

## 2021-07-15 MED ORDER — MIRTAZAPINE 15 MG PO TABS: 15.0000 mg | ORAL_TABLET | Freq: Every day | ORAL | 11 refills | Status: DC

## 2021-07-15 MED ORDER — TETANUS-DIPHTH-ACELL PERTUSSIS 5-2.5-18.5 LF-MCG/0.5 IM SUSY: 0.5000 mL | PREFILLED_SYRINGE | Freq: Once | INTRAMUSCULAR | 0 refills | Status: AC

## 2021-07-15 NOTE — Assessment & Plan Note (Addendum)
Annual physical as above. Due for flu, Tdap, Shingrix. Routine labs ordered. Flu here in the office, Tdap and Shingrix sent to pharmacy, bone density ordered.

## 2021-07-15 NOTE — Progress Notes (Signed)
Subjective:    CC: Annual Physical Exam  HPI:  This patient is here for their annual physical  I reviewed the past medical history, family history, social history, surgical history, and allergies today and no changes were needed.  Please see the problem list section below in epic for further details.  Past Medical History: Past Medical History:  Diagnosis Date   High cholesterol    Hypertension    Stroke (HCC)    3   Past Surgical History: Past Surgical History:  Procedure Laterality Date   PLACEMENT OF BREAST IMPLANTS  1970's   Social History: Social History   Socioeconomic History   Marital status: Married    Spouse name: John   Number of children: Not on file   Years of education: 1 yr college   Highest education level: Not on file  Occupational History   Occupation: Programme researcher, broadcasting/film/video  Tobacco Use   Smoking status: Former   Smokeless tobacco: Never  Substance and Sexual Activity   Alcohol use: Yes    Alcohol/week: 5.0 standard drinks    Types: 5 Glasses of wine per week   Drug use: No   Sexual activity: Not Currently    Partners: Male  Other Topics Concern   Not on file  Social History Narrative   Lives with husband   Caffeine use:  3 cups per day (coffee), 1 cup tea per day   Right-handed   Social Determinants of Health   Financial Resource Strain: Not on file  Food Insecurity: Not on file  Transportation Needs: Not on file  Physical Activity: Not on file  Stress: Not on file  Social Connections: Not on file   Family History: Family History  Problem Relation Age of Onset   Cancer Father    Breast cancer Sister    Heart disease Paternal Uncle    Lung cancer Maternal Grandfather    Allergies: No Known Allergies Medications: See med rec.  Review of Systems: No headache, visual changes, nausea, vomiting, diarrhea, constipation, dizziness, abdominal pain, skin rash, fevers, chills, night sweats, swollen lymph nodes, weight loss, chest pain, body  aches, joint swelling, muscle aches, shortness of breath, mood changes, visual or auditory hallucinations.  Objective:    General: Well Developed, well nourished, and in no acute distress.  Neuro: Alert and oriented x3, extra-ocular muscles intact, sensation grossly intact. Cranial nerves II through XII are intact, motor, sensory, and coordinative functions are all intact. HEENT: Normocephalic, atraumatic, pupils equal round reactive to light, neck supple, no masses, no lymphadenopathy, thyroid nonpalpable. Oropharynx, nasopharynx, external ear canals are unremarkable. Skin: Warm and dry, no rashes noted.  Cardiac: Regular rate and rhythm, no rubs or gallops, there was a 2/6 systolic ejection murmur over the right and left second intercostal space. Respiratory: Clear to auscultation bilaterally. Not using accessory muscles, speaking in full sentences.  Abdominal: Soft, nontender, nondistended, positive bowel sounds, no masses, no organomegaly.  Musculoskeletal: Shoulder, elbow, wrist, hip, knee, ankle stable, and with full range of motion.  Impression and Recommendations:    The patient was counselled, risk factors were discussed, anticipatory guidance given.  Annual physical exam Annual physical as above. Due for flu, Tdap, Shingrix. Routine labs ordered. Flu here in the office, Tdap and Shingrix sent to pharmacy, bone density ordered.   Benign essential hypertension Has not yet started her medications, she will go ahead and restart her lisinopril/HCTZ, amlodipine. She will then return in a nurse visit for blood pressure check in 2 weeks.  Dysphagia History of CVA, she has had dysarthria since, I do think her dysphagia is related, she has been out of her blood pressure medications for years with BPs in the 200s. Referral for speech-language pathology/barium swallow evaluation.  Failure to thrive in adult Checking routine labs. Adding mirtazapine.   Osteoporosis Rechecking  bone density test.  Blepharoptosis Worsening blepharoptosis, adding myasthenia gravis testing. She also like a surgical opinion from oculofacial plastics, happy to do this 2.  Left shoulder pain Patient brought up chronic left shoulder pain with difficulty AB ducting at the end of the visit, she will do some cuff strengthening, shoulder x-rays, return to see me in 4 to 6 weeks, I will evaluate this in further detail at a follow-up visit.  Systolic murmur Noted systolic murmur, suspect aortic stenosis, adding echo.   ___________________________________________ Ihor Austin. Benjamin Stain, M.D., ABFM., CAQSM. Primary Care and Sports Medicine  [facility]  Adjunct Professor of Family Medicine  [employer]

## 2021-07-15 NOTE — Assessment & Plan Note (Signed)
Noted systolic murmur, suspect aortic stenosis, adding echo.

## 2021-07-15 NOTE — Assessment & Plan Note (Signed)
Patient brought up chronic left shoulder pain with difficulty AB ducting at the end of the visit, she will do some cuff strengthening, shoulder x-rays, return to see me in 4 to 6 weeks, I will evaluate this in further detail at a follow-up visit.

## 2021-07-15 NOTE — Assessment & Plan Note (Signed)
Worsening blepharoptosis, adding myasthenia gravis testing. She also like a surgical opinion from oculofacial plastics, happy to do this 2.

## 2021-07-15 NOTE — Assessment & Plan Note (Signed)
Has not yet started her medications, she will go ahead and restart her lisinopril/HCTZ, amlodipine. She will then return in a nurse visit for blood pressure check in 2 weeks.

## 2021-07-15 NOTE — Assessment & Plan Note (Signed)
Rechecking bone density test.

## 2021-07-15 NOTE — Assessment & Plan Note (Signed)
History of CVA, she has had dysarthria since, I do think her dysphagia is related, she has been out of her blood pressure medications for years with BPs in the 200s. Referral for speech-language pathology/barium swallow evaluation.

## 2021-07-15 NOTE — Assessment & Plan Note (Signed)
Checking routine labs. Adding mirtazapine.

## 2021-07-26 ENCOUNTER — Other Ambulatory Visit: Payer: Self-pay | Admitting: Sports Medicine

## 2021-07-26 DIAGNOSIS — R1310 Dysphagia, unspecified: Secondary | ICD-10-CM

## 2021-07-29 ENCOUNTER — Ambulatory Visit (INDEPENDENT_AMBULATORY_CARE_PROVIDER_SITE_OTHER): Payer: Medicare Other | Admitting: Sports Medicine

## 2021-07-29 VITALS — BP 134/73 | HR 94

## 2021-07-29 DIAGNOSIS — M810 Age-related osteoporosis without current pathological fracture: Secondary | ICD-10-CM | POA: Diagnosis not present

## 2021-07-29 DIAGNOSIS — I10 Essential (primary) hypertension: Secondary | ICD-10-CM | POA: Diagnosis not present

## 2021-07-29 NOTE — Progress Notes (Signed)
Patient comes in today for blood pressure check.   Dawn Huerta is taking Lisinopril- HCTZ 20-25 mg and Amlodipine 10 mg daily for blood pressure control. Dawn Huerta denies any missed doses, side effects, headaches, chest pain, palpitations, dizziness, or shortness of breath.   Dawn Huerta hasn't been checking blood pressure readings at home, but states she will check every time she is at the pharmacy and those readings have been good.   Her blood pressure reading today is: 134/73. Her blood pressure reading last visit on 07/15/2021 was 189/128.  Dawn Huerta was advised to continue current medications and keep an eye on her blood pressure. She will call with any new symptoms and otherwise follow up in 6 months with PCP. Patient was made aware this note would be forwarded to Dr. Karie Schwalbe and if any changes are needed we will contact her by phone.

## 2021-07-29 NOTE — Assessment & Plan Note (Signed)
Dawn Huerta finally started her lisinopril/HCTZ and amlodipine, returns today and her blood pressure is adequately controlled, no changes in plan.

## 2021-08-04 ENCOUNTER — Other Ambulatory Visit (HOSPITAL_COMMUNITY): Payer: Self-pay

## 2021-08-04 ENCOUNTER — Other Ambulatory Visit: Payer: Self-pay | Admitting: Sports Medicine

## 2021-08-04 DIAGNOSIS — M810 Age-related osteoporosis without current pathological fracture: Secondary | ICD-10-CM

## 2021-08-04 DIAGNOSIS — R059 Cough, unspecified: Secondary | ICD-10-CM

## 2021-08-04 DIAGNOSIS — R1310 Dysphagia, unspecified: Secondary | ICD-10-CM

## 2021-08-06 ENCOUNTER — Other Ambulatory Visit: Payer: Self-pay | Admitting: Sports Medicine

## 2021-08-06 DIAGNOSIS — E785 Hyperlipidemia, unspecified: Secondary | ICD-10-CM

## 2021-08-06 DIAGNOSIS — I10 Essential (primary) hypertension: Secondary | ICD-10-CM

## 2021-08-09 ENCOUNTER — Other Ambulatory Visit: Payer: Self-pay | Admitting: Sports Medicine

## 2021-08-09 DIAGNOSIS — R627 Adult failure to thrive: Secondary | ICD-10-CM

## 2021-08-15 ENCOUNTER — Ambulatory Visit (HOSPITAL_COMMUNITY)
Admission: RE | Admit: 2021-08-15 | Discharge: 2021-08-15 | Disposition: A | Discharge status: Home / Self Care | Payer: Medicare Other | Source: Ambulatory Visit | Attending: Sports Medicine | Admitting: Sports Medicine

## 2021-08-15 ENCOUNTER — Telehealth: Payer: Self-pay

## 2021-08-15 ENCOUNTER — Other Ambulatory Visit: Payer: Self-pay

## 2021-08-15 DIAGNOSIS — R1310 Dysphagia, unspecified: Secondary | ICD-10-CM

## 2021-08-15 DIAGNOSIS — R059 Cough, unspecified: Secondary | ICD-10-CM | POA: Insufficient documentation

## 2021-08-15 NOTE — Telephone Encounter (Signed)
I saw the results, referral placed to GI.

## 2021-08-15 NOTE — Assessment & Plan Note (Signed)
History of CVA, dysarthria since, dysphagia likely related, swallow evaluation did show a large CP bar concerning for a polyp/mass, recommendation was for EGD, referral to gastroenterology.

## 2021-08-15 NOTE — Telephone Encounter (Signed)
[facility] called report. Results were not available in the system yet. Vicente Serene stated that he would get them uploaded so they were visible for review. Recommend EGD for follow up.

## 2021-08-18 ENCOUNTER — Other Ambulatory Visit: Payer: Self-pay | Admitting: Sports Medicine

## 2021-08-18 DIAGNOSIS — M810 Age-related osteoporosis without current pathological fracture: Secondary | ICD-10-CM

## 2021-08-23 ENCOUNTER — Other Ambulatory Visit: Payer: Self-pay | Admitting: Sports Medicine

## 2021-08-23 DIAGNOSIS — E785 Hyperlipidemia, unspecified: Secondary | ICD-10-CM

## 2021-08-23 DIAGNOSIS — I10 Essential (primary) hypertension: Secondary | ICD-10-CM

## 2021-08-24 ENCOUNTER — Other Ambulatory Visit: Payer: Self-pay

## 2021-08-24 ENCOUNTER — Ambulatory Visit (INDEPENDENT_AMBULATORY_CARE_PROVIDER_SITE_OTHER): Payer: Medicare Other

## 2021-08-24 DIAGNOSIS — M810 Age-related osteoporosis without current pathological fracture: Secondary | ICD-10-CM | POA: Diagnosis not present

## 2021-08-26 ENCOUNTER — Other Ambulatory Visit: Payer: Self-pay

## 2021-08-26 ENCOUNTER — Ambulatory Visit (INDEPENDENT_AMBULATORY_CARE_PROVIDER_SITE_OTHER): Payer: Medicare Other | Admitting: Sports Medicine

## 2021-08-26 ENCOUNTER — Telehealth: Payer: Self-pay | Admitting: Sports Medicine

## 2021-08-26 ENCOUNTER — Other Ambulatory Visit: Payer: Self-pay | Admitting: Sports Medicine

## 2021-08-26 DIAGNOSIS — I10 Essential (primary) hypertension: Secondary | ICD-10-CM

## 2021-08-26 DIAGNOSIS — R627 Adult failure to thrive: Secondary | ICD-10-CM

## 2021-08-26 DIAGNOSIS — M810 Age-related osteoporosis without current pathological fracture: Secondary | ICD-10-CM

## 2021-08-26 DIAGNOSIS — R4702 Dysphasia: Secondary | ICD-10-CM | POA: Diagnosis not present

## 2021-08-26 DIAGNOSIS — R1310 Dysphagia, unspecified: Secondary | ICD-10-CM

## 2021-08-26 DIAGNOSIS — E785 Hyperlipidemia, unspecified: Secondary | ICD-10-CM

## 2021-08-26 MED ORDER — CLOPIDOGREL BISULFATE 75 MG PO TABS: 75.0000 mg | ORAL_TABLET | Freq: Every day | ORAL | 3 refills | Status: DC

## 2021-08-26 MED ORDER — LISINOPRIL-HYDROCHLOROTHIAZIDE 20-25 MG PO TABS: 1.0000 | ORAL_TABLET | Freq: Every day | ORAL | 3 refills | Status: DC

## 2021-08-26 MED ORDER — AMLODIPINE BESYLATE 10 MG PO TABS: 10.0000 mg | ORAL_TABLET | Freq: Every day | ORAL | 3 refills | Status: DC

## 2021-08-26 MED ORDER — MIRTAZAPINE 15 MG PO TABS: 15.0000 mg | ORAL_TABLET | Freq: Every day | ORAL | 3 refills | Status: DC

## 2021-08-26 MED ORDER — ASPIRIN 81 MG PO TBEC: 81.0000 mg | DELAYED_RELEASE_TABLET | Freq: Every day | ORAL | 3 refills | Status: DC

## 2021-08-26 MED ORDER — ATORVASTATIN CALCIUM 20 MG PO TABS: 20.0000 mg | ORAL_TABLET | Freq: Every day | ORAL | 3 refills | Status: DC

## 2021-08-26 NOTE — Assessment & Plan Note (Signed)
Switching from Fosamax to Prolia.

## 2021-08-26 NOTE — Progress Notes (Signed)
    Procedures performed today:    None.  Independent interpretation of notes and tests performed by another provider:   None.  Brief History, Exam, Impression, and Recommendations:    Benign essential hypertension Blood pressure was controlled on lisinopril/HCTZ and amlodipine, she has run out of her medications and it is elevated today.  Osteoporosis Switching to Prolia, we will work on getting her approved, labs today.  Blood pressure elevated, this is a chronic process with exacerbation, pharmacologic intervention.  ___________________________________________ Dawn Huerta. Benjamin Stain, M.D., ABFM., CAQSM. Primary Care and Sports Medicine [facility]  Adjunct Instructor of Family Medicine  [employer]

## 2021-08-26 NOTE — Telephone Encounter (Signed)
Prolia approval please, worsening osteoporosis, i.e. failed Fosamax.  Labs should populate in tonight.

## 2021-08-26 NOTE — Assessment & Plan Note (Signed)
Switching to Prolia, we will work on getting her approved, labs today.

## 2021-08-26 NOTE — Assessment & Plan Note (Signed)
Blood pressure was controlled on lisinopril/HCTZ and amlodipine, she has run out of her medications and it is elevated today.

## 2021-08-31 ENCOUNTER — Ambulatory Visit (INDEPENDENT_AMBULATORY_CARE_PROVIDER_SITE_OTHER): Payer: Medicare Other | Admitting: Sports Medicine

## 2021-08-31 ENCOUNTER — Other Ambulatory Visit: Payer: Self-pay

## 2021-08-31 VITALS — BP 171/96 | HR 58 | Ht 62.5 in | Wt 104.0 lb

## 2021-08-31 DIAGNOSIS — M810 Age-related osteoporosis without current pathological fracture: Secondary | ICD-10-CM

## 2021-08-31 MED ORDER — DENOSUMAB 60 MG/ML ~~LOC~~ SOSY
60.0000 mg | PREFILLED_SYRINGE | Freq: Once | SUBCUTANEOUS | Status: AC
Start: 2021-08-31 — End: 2021-08-31
  Administered 2021-08-31: 14:00:00 60 mg via SUBCUTANEOUS

## 2021-08-31 NOTE — Telephone Encounter (Signed)
Pt presented as a NV for Prolia injection today. PA not completed. Per Cherylann Parr, CMA, a PA is not required with her insurance.

## 2021-08-31 NOTE — Progress Notes (Signed)
Pt is here for a Prolia injection. Pt reports taking calcium and vitamin D daily. Pt's calcium levels and kidney function was within normal limits.   Pt tolerated injection well without complications. Pt advised to schedule next injection in 6 months.  Pt's BP is elevated: 193/85 reported to Dr. Karie Schwalbe.

## 2021-09-01 LAB — COMPREHENSIVE METABOLIC PANEL
AG Ratio: 1.3 (calc) (ref 1.0–2.5)
ALT: 17 U/L (ref 6–29)
AST: 23 U/L (ref 10–35)
Albumin: 4.3 g/dL (ref 3.6–5.1)
Alkaline phosphatase (APISO): 74 U/L (ref 37–153)
BUN: 13 mg/dL (ref 7–25)
CO2: 24 mmol/L (ref 20–32)
Calcium: 9.5 mg/dL (ref 8.6–10.4)
Chloride: 97 mmol/L — ABNORMAL LOW (ref 98–110)
Creat: 0.62 mg/dL (ref 0.60–1.00)
Globulin: 3.3 g/dL (calc) (ref 1.9–3.7)
Glucose, Bld: 97 mg/dL (ref 65–139)
Potassium: 4 mmol/L (ref 3.5–5.3)
Sodium: 133 mmol/L — ABNORMAL LOW (ref 135–146)
Total Bilirubin: 0.5 mg/dL (ref 0.2–1.2)
Total Protein: 7.6 g/dL (ref 6.1–8.1)

## 2021-09-01 LAB — CBC
HCT: 37.1 % (ref 35.0–45.0)
Hemoglobin: 12.7 g/dL (ref 11.7–15.5)
MCH: 32.1 pg (ref 27.0–33.0)
MCHC: 34.2 g/dL (ref 32.0–36.0)
MCV: 93.7 fL (ref 80.0–100.0)
MPV: 12.1 fL (ref 7.5–12.5)
Platelets: 337 10*3/uL (ref 140–400)
RBC: 3.96 10*6/uL (ref 3.80–5.10)
RDW: 12.1 % (ref 11.0–15.0)
WBC: 8.5 10*3/uL (ref 3.8–10.8)

## 2021-09-01 LAB — MYASTHENIA GRAVIS PANEL 1
A CHR BINDING ABS: <0.3 nmol/L
STRIATED MUSCLE AB SCREEN: NEGATIVE

## 2021-09-01 LAB — LIPID PANEL
Cholesterol: 203 mg/dL — ABNORMAL HIGH (ref <200)
HDL: 73 mg/dL (ref >=50)
LDL Cholesterol (Calc): 114 mg/dL (calc) — ABNORMAL HIGH
Non-HDL Cholesterol (Calc): 130 mg/dL (calc) — ABNORMAL HIGH (ref <130)
Total CHOL/HDL Ratio: 2.8 (calc) (ref <5.0)
Triglycerides: 70 mg/dL (ref <150)

## 2021-09-01 LAB — TSH: TSH: 2.2 mIU/L (ref 0.40–4.50)

## 2021-09-14 ENCOUNTER — Ambulatory Visit (HOSPITAL_BASED_OUTPATIENT_CLINIC_OR_DEPARTMENT_OTHER): Payer: Medicare Other

## 2021-09-23 ENCOUNTER — Ambulatory Visit: Payer: Medicare Other | Admitting: Sports Medicine

## 2021-10-10 ENCOUNTER — Other Ambulatory Visit: Payer: Self-pay

## 2021-10-10 ENCOUNTER — Ambulatory Visit (HOSPITAL_BASED_OUTPATIENT_CLINIC_OR_DEPARTMENT_OTHER)
Admission: RE | Admit: 2021-10-10 | Discharge: 2021-10-10 | Disposition: A | Discharge status: Home / Self Care | Payer: Medicare Other | Source: Ambulatory Visit | Attending: Sports Medicine | Admitting: Sports Medicine

## 2021-10-10 DIAGNOSIS — R011 Cardiac murmur, unspecified: Secondary | ICD-10-CM | POA: Insufficient documentation

## 2021-10-10 NOTE — Progress Notes (Signed)
°  Echocardiogram 2D Echocardiogram has been performed.  Elmer Ramp 10/10/2021, 3:56 PM

## 2021-10-11 LAB — ECHOCARDIOGRAM COMPLETE
AR max vel: 1.64 cm2
AV Area VTI: 1.42 cm2
AV Area mean vel: 1.36 cm2
AV Mean grad: 9 mmHg
AV Peak grad: 17.8 mmHg
Ao pk vel: 2.11 m/s
Area-P 1/2: 2.47 cm2
S' Lateral: 2 cm

## 2021-10-12 ENCOUNTER — Telehealth: Payer: Self-pay

## 2021-10-12 NOTE — Telephone Encounter (Signed)
Patient states she hasn't heard anything about her GI referral. Please check into this.

## 2021-10-26 NOTE — Telephone Encounter (Signed)
Left msg for patient that contained the GI office phone number and requested that patient just call to schedule at her convenience.

## 2021-10-28 ENCOUNTER — Ambulatory Visit: Payer: Medicare Other

## 2022-03-17 ENCOUNTER — Telehealth: Payer: Self-pay

## 2022-03-17 DIAGNOSIS — M810 Age-related osteoporosis without current pathological fracture: Secondary | ICD-10-CM

## 2022-03-17 NOTE — Telephone Encounter (Signed)
REF# [id number] NO PA required for prolia  Please call and get patient scheduled for labs and prolia. Lab orders in.

## 2022-03-22 ENCOUNTER — Ambulatory Visit: Payer: Medicare Other

## 2022-03-23 LAB — COMPLETE METABOLIC PANEL WITH GFR
AG Ratio: 1.6 (calc) (ref 1.0–2.5)
ALT: 26 U/L (ref 6–29)
AST: 29 U/L (ref 10–35)
Albumin: 4.9 g/dL (ref 3.6–5.1)
Alkaline phosphatase (APISO): 53 U/L (ref 37–153)
BUN: 10 mg/dL (ref 7–25)
CO2: 25 mmol/L (ref 20–32)
Calcium: 10.3 mg/dL (ref 8.6–10.4)
Chloride: 104 mmol/L (ref 98–110)
Creat: 0.65 mg/dL (ref 0.60–1.00)
Globulin: 3.1 g/dL (calc) (ref 1.9–3.7)
Glucose, Bld: 74 mg/dL (ref 65–99)
Potassium: 3.9 mmol/L (ref 3.5–5.3)
Sodium: 142 mmol/L (ref 135–146)
Total Bilirubin: 0.5 mg/dL (ref 0.2–1.2)
Total Protein: 8 g/dL (ref 6.1–8.1)
eGFR: 90 mL/min/{1.73_m2} (ref >=60)

## 2022-04-05 ENCOUNTER — Ambulatory Visit: Payer: Medicare Other

## 2022-04-13 ENCOUNTER — Ambulatory Visit: Payer: Medicare Other

## 2022-04-14 ENCOUNTER — Ambulatory Visit (INDEPENDENT_AMBULATORY_CARE_PROVIDER_SITE_OTHER): Payer: Medicare Other | Admitting: Sports Medicine

## 2022-04-14 VITALS — BP 129/68 | HR 86 | Ht 62.5 in | Wt 99.1 lb

## 2022-04-14 DIAGNOSIS — M810 Age-related osteoporosis without current pathological fracture: Secondary | ICD-10-CM

## 2022-04-14 MED ORDER — DENOSUMAB 60 MG/ML ~~LOC~~ SOSY
60.0000 mg | PREFILLED_SYRINGE | Freq: Once | SUBCUTANEOUS | Status: AC
  Administered 2022-04-14: 60 mg via SUBCUTANEOUS

## 2022-04-14 NOTE — Progress Notes (Signed)
Pt is here for a Prolia injection. Pt reports taking a multi-vitamin. Pt's calcium and kidney function was within normal limits.   Pt tolerated injection well without complications. Pt advised to schedule next injection in 6 months.  Pt is concerned about her weight loss. States that she chokes on food so she is eating all day in small amounts. Family history of oculopharyngeal muscular dystrophy.   Currently eating solid food along with 3 high calorie drinks and 2 protein bars.   Dr. Karie Schwalbe has been advised. Pt has scheduled appointment for abnormal weight loss.

## 2022-04-19 ENCOUNTER — Other Ambulatory Visit: Payer: Self-pay | Admitting: Sports Medicine

## 2022-04-19 ENCOUNTER — Encounter: Payer: Self-pay | Admitting: Sports Medicine

## 2022-04-19 ENCOUNTER — Ambulatory Visit (INDEPENDENT_AMBULATORY_CARE_PROVIDER_SITE_OTHER): Payer: Medicare Other | Admitting: Sports Medicine

## 2022-04-19 DIAGNOSIS — R4702 Dysphasia: Secondary | ICD-10-CM

## 2022-04-19 DIAGNOSIS — I639 Cerebral infarction, unspecified: Secondary | ICD-10-CM

## 2022-04-19 DIAGNOSIS — I10 Essential (primary) hypertension: Secondary | ICD-10-CM | POA: Diagnosis not present

## 2022-04-19 DIAGNOSIS — R1310 Dysphagia, unspecified: Secondary | ICD-10-CM | POA: Diagnosis not present

## 2022-04-19 DIAGNOSIS — R627 Adult failure to thrive: Secondary | ICD-10-CM

## 2022-04-19 DIAGNOSIS — E785 Hyperlipidemia, unspecified: Secondary | ICD-10-CM

## 2022-04-19 MED ORDER — MIRTAZAPINE 7.5 MG PO TABS: 7.5000 mg | ORAL_TABLET | Freq: Every day | ORAL | 3 refills | Status: DC

## 2022-04-19 MED ORDER — CLOPIDOGREL BISULFATE 75 MG PO TABS: 75.0000 mg | ORAL_TABLET | Freq: Every day | ORAL | 3 refills | Status: DC

## 2022-04-19 MED ORDER — ASPIRIN 81 MG PO TBEC: 81.0000 mg | DELAYED_RELEASE_TABLET | Freq: Every day | ORAL | 3 refills | Status: DC

## 2022-04-19 MED ORDER — LISINOPRIL-HYDROCHLOROTHIAZIDE 10-12.5 MG PO TABS: 1.0000 | ORAL_TABLET | Freq: Every day | ORAL | 3 refills | Status: DC

## 2022-04-19 MED ORDER — ATORVASTATIN CALCIUM 20 MG PO TABS: 20.0000 mg | ORAL_TABLET | Freq: Every day | ORAL | 3 refills | Status: DC

## 2022-04-19 MED ORDER — AMLODIPINE BESYLATE 10 MG PO TABS: 10.0000 mg | ORAL_TABLET | Freq: Every day | ORAL | 3 refills | Status: DC

## 2022-04-19 NOTE — Assessment & Plan Note (Addendum)
Dawn Huerta returns, she has a history of a CVA, she had dysarthria and dysphagia since, swallow evaluation did show a large CP bar concerning for a polyp/mass, we had referred for EGD but she never followed through. We had also started mirtazapine, she has self discontinued this but never told us. She is agreeable to restart this at 7.5 mg nightly. She is back complaining of difficulty swallowing, speaking, and complaining of weight loss. She has actually gained 5 pounds since last visit. I did suggest following through with the above work-up, but she has refused. Risks and benefits understood. My recommendation should she change her mind is repeat modified barium swallow evaluation, referral back to gastroenterology.

## 2022-04-19 NOTE — Assessment & Plan Note (Signed)
Was supposed to be on lisinopril/HCTZ and amlodipine. She had 1 year supplies of both these medicines sent in in December 2022. She is back off of lisinopril/HCTZ with an elevated blood pressure and worsening symptoms of dysarthria and dysphagia, I do think she has probably had another CVA however she has declined additional work-up and intervention. She does agree to restart her lisinopril/HCTZ so I think we should only restart at half dose for now. I do think she should come back in a nurse visit for blood pressure check in 2 weeks.

## 2022-04-19 NOTE — Progress Notes (Signed)
    Procedures performed today:    None.  Independent interpretation of notes and tests performed by another provider:   None.  Brief History, Exam, Impression, and Recommendations:    [name] returns, she has a history of a CVA, she had dysarthria and dysphagia since, swallow evaluation did show a large CP bar concerning for a polyp/mass, we had referred for EGD but she never followed through. We had also started mirtazapine, she has self discontinued this but never told us. She is agreeable to restart this at 7.5 mg nightly. She is back complaining of difficulty swallowing, speaking, and complaining of weight loss. She has actually gained 5 pounds since last visit. I did suggest following through with the above work-up, but she has refused. Risks and benefits understood. My recommendation should she change her mind is repeat modified barium swallow evaluation, referral back to gastroenterology.  CVA (cerebral vascular accident) [facility]) [place] is also noncompliant with her blood pressure medications, she had a CVA with persistent mild dysarthria, likely resultant dysphagia as well. She was supposed to be on amlodipine and lisinopril/HCTZ. She was also supposed to be on Plavix. She tells [place] that we never refilled her Plavix however she did get a refill from [place] in December 2022 with a 1 year supply. She also tells me that she did not want to take an additional blood pressure medication, and she is in denial that she may have had another CVA. Risks, benefits explained, she declines additional work-up.  Benign essential hypertension Was supposed to be on lisinopril/HCTZ and amlodipine. She had 1 year supplies of both these medicines sent in in December 2022. She is back off of lisinopril/HCTZ with an elevated blood pressure and worsening symptoms of dysarthria and dysphagia, I do think she has probably had another CVA however she has declined additional work-up and  intervention. She does agree to restart her lisinopril/HCTZ so I think we should only restart at half dose for now. I do think she should come back in a nurse visit for blood pressure check in 2 weeks.    ____________________________________________ Dawn Huerta. Dawn Huerta, M.D., ABFM., CAQSM., AME. Primary Care and Sports Medicine [facility]  Adjunct Professor of Family Medicine  [employer]  Restaurant manager, fast food

## 2022-04-19 NOTE — Assessment & Plan Note (Signed)
Dawn Huerta is also noncompliant with her blood pressure medications, she had a CVA with persistent mild dysarthria, likely resultant dysphagia as well. She was supposed to be on amlodipine and lisinopril/HCTZ. She was also supposed to be on Plavix. She tells [place] that we never refilled her Plavix however she did get a refill from [place] in December 2022 with a 1 year supply. She also tells me that she did not want to take an additional blood pressure medication, and she is in denial that she may have had another CVA. Risks, benefits explained, she declines additional work-up.

## 2022-05-03 ENCOUNTER — Ambulatory Visit (INDEPENDENT_AMBULATORY_CARE_PROVIDER_SITE_OTHER): Payer: BLUE CROSS/BLUE SHIELD | Admitting: Sports Medicine

## 2022-05-03 VITALS — BP 132/73 | HR 90 | Resp 20 | Ht 62.5 in | Wt 103.1 lb

## 2022-05-03 DIAGNOSIS — I639 Cerebral infarction, unspecified: Secondary | ICD-10-CM

## 2022-05-03 DIAGNOSIS — I10 Essential (primary) hypertension: Secondary | ICD-10-CM

## 2022-05-03 NOTE — Progress Notes (Signed)
   Subjective:    Patient ID: Charlisa Cham, female    DOB: July 19, 1946, 76 y.o.   MRN: [id number]  HPI Patient is here for blood pressure and weight check. Denies trouble sleeping, palpitations or medication problems.   Review of Systems     Objective:   Physical Exam        Assessment & Plan:   Patient advised to schedule a physical with Dr. Benjamin Stain in 6 months.

## 2022-08-29 ENCOUNTER — Other Ambulatory Visit: Payer: Self-pay | Admitting: Sports Medicine

## 2022-08-29 DIAGNOSIS — R627 Adult failure to thrive: Secondary | ICD-10-CM

## 2022-08-31 ENCOUNTER — Ambulatory Visit
Admission: RE | Admit: 2022-08-31 | Discharge: 2022-08-31 | Disposition: A | Discharge status: Home / Self Care | Payer: Medicare Other | Source: Ambulatory Visit | Attending: Family | Admitting: Family

## 2022-08-31 ENCOUNTER — Other Ambulatory Visit: Payer: Self-pay | Admitting: Family

## 2022-08-31 DIAGNOSIS — M19012 Primary osteoarthritis, left shoulder: Secondary | ICD-10-CM

## 2022-10-23 ENCOUNTER — Ambulatory Visit: Payer: Medicare HMO | Admitting: Diagnostic Neuroimaging

## 2022-10-23 ENCOUNTER — Encounter: Payer: Self-pay | Admitting: Diagnostic Neuroimaging

## 2022-10-23 VITALS — BP 134/81 | HR 94 | Ht <= 58 in | Wt 106.6 lb

## 2022-10-23 DIAGNOSIS — R471 Dysarthria and anarthria: Secondary | ICD-10-CM

## 2022-10-23 DIAGNOSIS — H02403 Unspecified ptosis of bilateral eyelids: Secondary | ICD-10-CM

## 2022-10-23 NOTE — Patient Instructions (Addendum)
MEMORY LOSS (has been part of [facility] studies) - MMSE 28/30, no major changes in ADLs - has some elevated amyloid in research amyloid PET scans - safety / supervision issues reviewed - daily physical activity / exercise (at least 15-30 minutes) - eat more plants / vegetables - increase social activities, brain stimulation, games, puzzles, hobbies, crafts, arts, music - aim for at least 7-8 hours sleep per night (or more) - avoid smoking and alcohol - caregiver resources provided - caution with medications, finances, driving  DYSARTHRIA, PTOSIS (family history of OPMD) - check addl labs  STROKE PREVENTION - recommend aspirin 81mg  daily (taking 3x per week due to excessive bleeding) - continue atorvastatin, amlodipine

## 2022-10-23 NOTE — Progress Notes (Signed)
[facility]  PATIENT: Dawn Huerta DOB: 06-Feb-1946  REFERRING CLINICIAN: Sonia Side., FNP HISTORY FROM: patient  REASON FOR VISIT: new consult   HISTORICAL  CHIEF COMPLAINT:  Chief Complaint  Patient presents with   New Patient (Initial Visit)    Patient in room #7 with her husband in the lobby.  Pt here today to f/u with her memory loss. Patient also has question on OPMD.    HISTORY OF PRESENT ILLNESS:   77 year old female here for evaluation of memory loss and ptosis and dysarthria.  Patient has had some mild memory loss since around 2019/2020.  She enrolled in a memory loss study at [facility].  She is not sure if she was given a particular diagnosis.  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  She also was given a research medication in the past.  No major changes in ADLs.  She lives at home with husband.  It does take a little bit longer to perform tasks and sometimes she has difficulty with time management.  Able to take care of herself, hygiene, bathing, dressing, feeding, shopping, driving and other household chores.  Able to function independently outside of home.  Also patient concerned about sister's diagnosis of ocular pharyngeal muscular dystrophy.  Apparently patient's aunts, uncles and grandmother on father side have also had similar symptoms.  She has had gradual and progressive dysarthria, vision problems and ptosis.    REVIEW OF SYSTEMS: Full 14 system review of systems performed and negative with exception of: as per HPI.  ALLERGIES: No Known Allergies  HOME MEDICATIONS: Outpatient Medications Prior to Visit  Medication Sig Dispense Refill   amLODipine (NORVASC) 10 MG tablet Take 1 tablet (10 mg total) by mouth daily. 90 tablet 3   atorvastatin (LIPITOR) 20 MG tablet Take 1 tablet (20 mg total) by mouth daily. 90 tablet 3   denosumab (PROLIA) 60 MG/ML SOSY injection Inject 60 mg  into the skin every 6 (six) months. Administer in upper arm, thigh, or abdomen     ibuprofen (ADVIL) 200 MG tablet Take 200 mg by mouth every 6 (six) hours as needed.     Multiple Vitamins-Minerals (EQ COMPLETE MULTIVITAMIN-ADULT PO) Take by mouth.     Throat Lozenges [facility] FRIEND MT) Use as directed in the mouth or throat as needed. Menthol Cough Supplement losenges     No facility-administered medications prior to visit.    PAST MEDICAL HISTORY: Past Medical History:  Diagnosis Date   High cholesterol    Hypertension    Stroke ([place])    3    PAST SURGICAL HISTORY: Past Surgical History:  Procedure Laterality Date   PLACEMENT OF BREAST IMPLANTS  1970's    FAMILY HISTORY: Family History  Problem Relation Age of Onset   Cancer Father    Breast cancer Sister    Heart disease Paternal Uncle    Lung cancer Maternal Grandfather     SOCIAL HISTORY: Social History   Socioeconomic History   Marital status: Married    Spouse name: John   Number of children: Not on file   Years of education: 1 yr college   Highest education level: Not on file  Occupational History   Occupation: Engineering geologist  Tobacco Use   Smoking status: Former   Smokeless tobacco: Never  Substance and Sexual Activity   Alcohol use: Yes    Alcohol/week: 5.0 standard drinks of alcohol    Types:  5 Glasses of wine per week   Drug use: No   Sexual activity: Not Currently    Partners: Male  Other Topics Concern   Not on file  Social History Narrative   Lives with husband   Caffeine use:  3 cups per day (coffee), 1 cup tea per day   Right-handed   Social Determinants of Health   Financial Resource Strain: Not on file  Food Insecurity: Not on file  Transportation Needs: Not on file  Physical Activity: Not on file  Stress: Not on file  Social Connections: Not on file  Intimate Partner Violence: Not on file     PHYSICAL EXAM  GENERAL EXAM/CONSTITUTIONAL: Vitals:  Vitals:   10/23/22  1010  BP: 134/81  Pulse: 94  Weight: 106 lb 9.6 oz (48.4 kg)  Height: 3' (0.914 m)   Body mass index is 57.83 kg/m. Wt Readings from Last 3 Encounters:  10/23/22 106 lb 9.6 oz (48.4 kg)  05/03/22 103 lb 1.9 oz (46.8 kg)  04/19/22 104 lb (47.2 kg)   Patient is in no distress; well developed, nourished and groomed; neck is supple  CARDIOVASCULAR: Examination of carotid arteries is normal; no carotid bruits Regular rate and rhythm, no murmurs Examination of peripheral vascular system by observation and palpation is normal  EYES: Ophthalmoscopic exam of optic discs and posterior segments is normal; no papilledema or hemorrhages No results found.  MUSCULOSKELETAL: Gait, strength, tone, movements noted in Neurologic exam below  NEUROLOGIC: MENTAL STATUS:     10/23/2022   10:12 AM  MMSE - Mini Mental State Exam  Orientation to time 4  Orientation to Place 5  Registration 3  Attention/ Calculation 5  Recall 3  Language- name 2 objects 1  Language- repeat 1  Language- follow 3 step command 3  Language- read & follow direction 1  Write a sentence 1  Copy design 1  Total score 28   awake, alert, oriented to person, place and time recent and remote memory intact normal attention and concentration language fluent, comprehension intact, naming intact fund of knowledge appropriate  CRANIAL NERVE:  2nd - no papilledema on fundoscopic exam 2nd, 3rd, 4th, 6th - pupils equal and reactive to light, visual fields full to confrontation, extraocular muscles intact, no nystagmus; MILD WEAKNESS OF EYEBROW RAISE 5th - facial sensation symmetric 7th - facial strength symmetric 8th - hearing intact 9th - palate elevates symmetrically, uvula midline 11th - shoulder shrug symmetric 12th - tongue protrusion midline MILD DYSARTRIA  MOTOR:  normal bulk and tone, full strength in the BUE, BLE  SENSORY:  normal and symmetric to light touch, temperature, vibration  COORDINATION:   finger-nose-finger, fine finger movements normal  REFLEXES:  deep tendon reflexes trace and symmetric  GAIT/STATION:  narrow based gait    DIAGNOSTIC DATA (LABS, IMAGING, TESTING) - I reviewed patient records, labs, notes, testing and imaging myself where available.  Lab Results  Component Value Date   WBC 8.5 08/26/2021   HGB 12.7 08/26/2021   HCT 37.1 08/26/2021   MCV 93.7 08/26/2021   PLT 337 08/26/2021      Component Value Date/Time   NA 142 03/22/2022 1411   K 3.9 03/22/2022 1411   CL 104 03/22/2022 1411   CO2 25 03/22/2022 1411   GLUCOSE 74 03/22/2022 1411   BUN 10 03/22/2022 1411   CREATININE 0.65 03/22/2022 1411   CALCIUM 10.3 03/22/2022 1411   PROT 8.0 03/22/2022 1411   ALBUMIN 4.5 12/01/2016 1505  AST 29 03/22/2022 1411   ALT 26 03/22/2022 1411   ALKPHOS 63 12/01/2016 1505   BILITOT 0.5 03/22/2022 1411   Lab Results  Component Value Date   CHOL 203 (H) 08/26/2021   HDL 73 08/26/2021   LDLCALC 114 (H) 08/26/2021   TRIG 70 08/26/2021   CHOLHDL 2.8 08/26/2021   Lab Results  Component Value Date   HGBA1C 4.9 12/01/2016   No results found for: "VITAMINB12" Lab Results  Component Value Date   TSH 2.20 08/26/2021    12/02/16 MRI brain [I reviewed images myself and agree with interpretation. -VRP]  1. Acute to early subacute lacunar infarct in the right thalamus. 2. Advanced chronic small vessel ischemic disease. Chronic cerebral and cerebellar infarcts as above.     ASSESSMENT AND PLAN  77 y.o. year old female here with:   Dx:  1. Dysarthria   2. Ptosis of both eyelids       PLAN:  MEMORY LOSS (since ~2020; has been part of [facility] studies; unclear diagnosis, but patient will reach out to find out) - MMSE 28/30, no major changes in ADLs; has some elevated amyloid in research amyloid PET scans; suspect mild cognitive impairment vs mild dementia - safety / supervision issues reviewed - daily physical activity / exercise  (at least 15-30 minutes) - eat more plants / vegetables - increase social activities, brain stimulation, games, puzzles, hobbies, crafts, arts, music - aim for at least 7-8 hours sleep per night (or more) - avoid smoking and alcohol - caregiver resources provided - caution with medications, finances, driving  DYSARTHRIA, 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    Penni Bombard, MD 4/0/0867, 61:95 AM Certified in Neurology, Neurophysiology and Neuroimaging  [facility] [address], [place], [place] [address] [phone]

## 2022-11-01 ENCOUNTER — Other Ambulatory Visit: Payer: Self-pay | Admitting: Emergency Medicine

## 2022-11-01 DIAGNOSIS — F0280 Dementia in other diseases classified elsewhere without behavioral disturbance: Secondary | ICD-10-CM

## 2022-11-01 LAB — ALDOLASE: Aldolase: 7.6 U/L (ref 3.3–10.3)

## 2022-11-01 LAB — CK: Total CK: 108 U/L (ref 32–182)

## 2022-11-01 LAB — MUSK ANTIBODIES: MuSK Antibodies: <1 U/mL

## 2022-11-01 LAB — VITAMIN B12: Vitamin B-12: 712 pg/mL (ref 232–1245)

## 2022-11-01 LAB — ACHR ABS WITH REFLEX TO MUSK: AChR Binding Ab, Serum: <0.03 nmol/L (ref 0.00–0.24)

## 2022-11-01 LAB — HEMOGLOBIN A1C
Est. average glucose Bld gHb Est-mCnc: 114 mg/dL
Hgb A1c MFr Bld: 5.6 % (ref 4.8–5.6)

## 2022-11-01 LAB — TSH: TSH: 1.84 u[IU]/mL (ref 0.450–4.500)

## 2022-11-03 ENCOUNTER — Encounter: Payer: Medicare Other | Admitting: Sports Medicine

## 2022-11-15 ENCOUNTER — Other Ambulatory Visit: Payer: BLUE CROSS/BLUE SHIELD

## 2022-11-16 ENCOUNTER — Ambulatory Visit
Admission: RE | Admit: 2022-11-16 | Discharge: 2022-11-16 | Disposition: A | Discharge status: Home / Self Care | Payer: Self-pay | Source: Ambulatory Visit | Attending: Emergency Medicine | Admitting: Emergency Medicine

## 2022-11-16 DIAGNOSIS — F0280 Dementia in other diseases classified elsewhere without behavioral disturbance: Secondary | ICD-10-CM

## 2022-11-16 NOTE — Discharge Instructions (Signed)

## 2022-11-28 ENCOUNTER — Telehealth: Payer: Self-pay

## 2022-11-28 NOTE — Telephone Encounter (Signed)
Contacted pt, informed her labs came back unremarkable, no concerns noted. Advised to call the office back with any questions or concerns as she had none at this time was appreciated.

## 2022-11-28 NOTE — Telephone Encounter (Signed)
-----   Message from Penni Bombard, MD sent at 11/27/2022  3:24 PM EDT ----- Unremarkable labs. Continue current plan. Please call patient. -VRP

## 2023-01-12 NOTE — Progress Notes (Signed)
Added diagnosos code R79.9.Marland Kitchen

## 2023-06-07 ENCOUNTER — Other Ambulatory Visit: Payer: Self-pay | Admitting: Sports Medicine

## 2023-06-07 DIAGNOSIS — E785 Hyperlipidemia, unspecified: Secondary | ICD-10-CM

## 2023-06-07 DIAGNOSIS — I10 Essential (primary) hypertension: Secondary | ICD-10-CM

## 2023-07-03 ENCOUNTER — Other Ambulatory Visit: Payer: Self-pay | Admitting: Sports Medicine

## 2023-07-03 DIAGNOSIS — E785 Hyperlipidemia, unspecified: Secondary | ICD-10-CM

## 2023-07-03 DIAGNOSIS — I10 Essential (primary) hypertension: Secondary | ICD-10-CM

## 2023-07-30 IMAGING — RF DG SWALLOWING FUNCTION
12 of 21 series · 12 of 24 positions shown · non-contrast
Comparison: None

CLINICAL DATA: Dysphagia, change in voice getting progressively
worse.

EXAM:
MODIFIED BARIUM SWALLOW
TECHNIQUE: Different consistencies of barium were administered orally to the
patient by the Speech Pathologist. Imaging of the pharynx was
performed in the lateral projection. The radiologist was present in
the fluoroscopy room for this study, providing personal supervision.
FLUOROSCOPY TIME:  Fluoroscopy Time:  1 minutes 50 seconds
Radiation Exposure Index (if provided by the fluoroscopic device):
13.10 mGy
Number of Acquired Spot Images: 0

[Series 2: run · 1 of 47 frames shown (1 of 12)]
[frame 8/47]
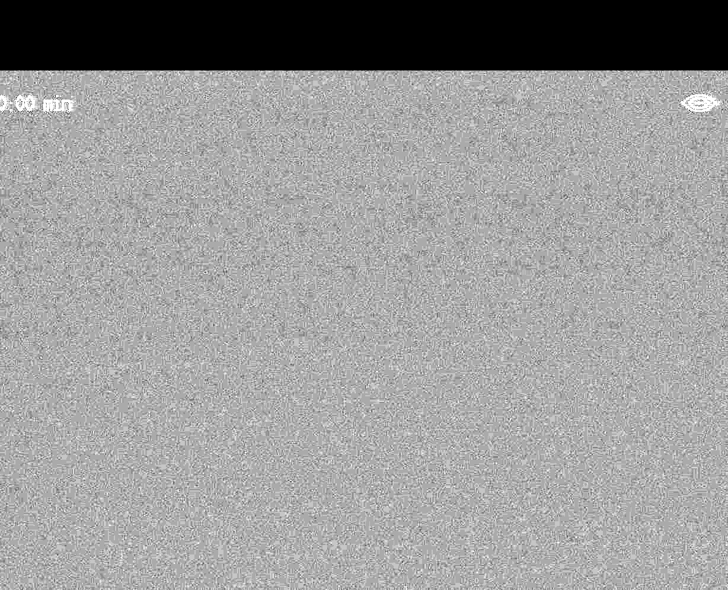

[Series 3: run · 1 of 18 frames shown (2 of 12)]
[frame 16/18]
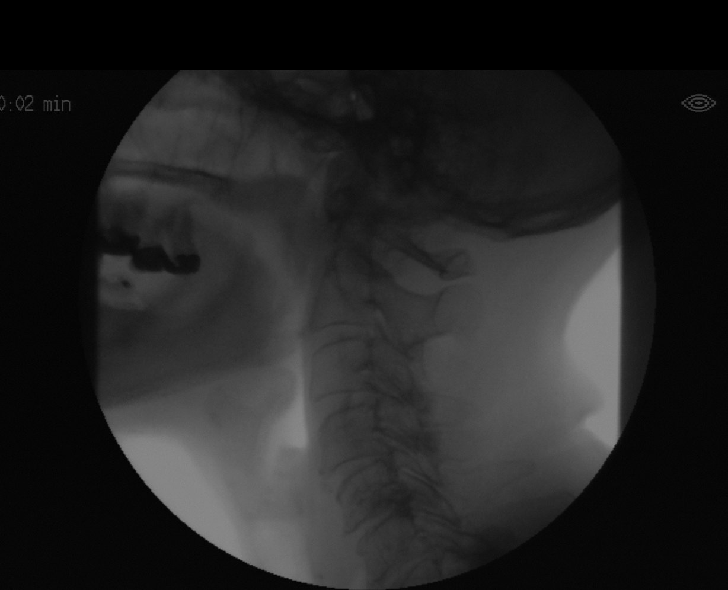

[Series 5: run · 1 of 215 frames shown (3 of 12)]
[frame 108/215]
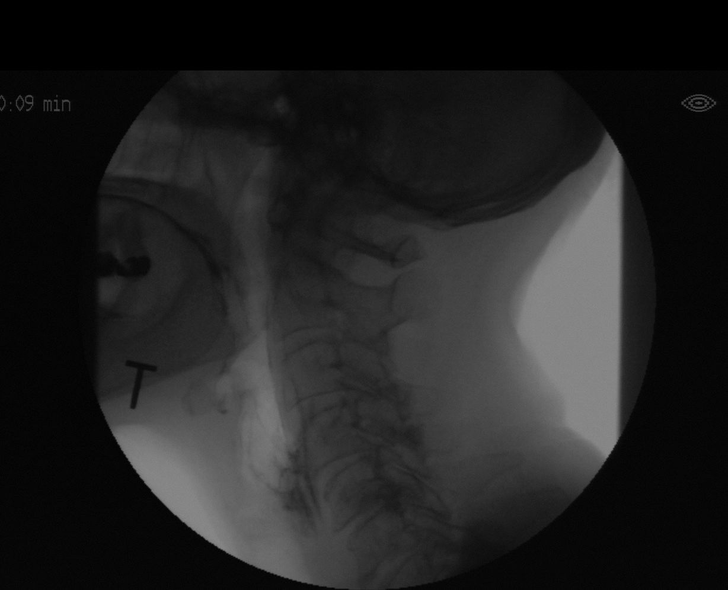

[Series 7: run · 1 of 180 frames shown (4 of 12)]
[frame 91/180]
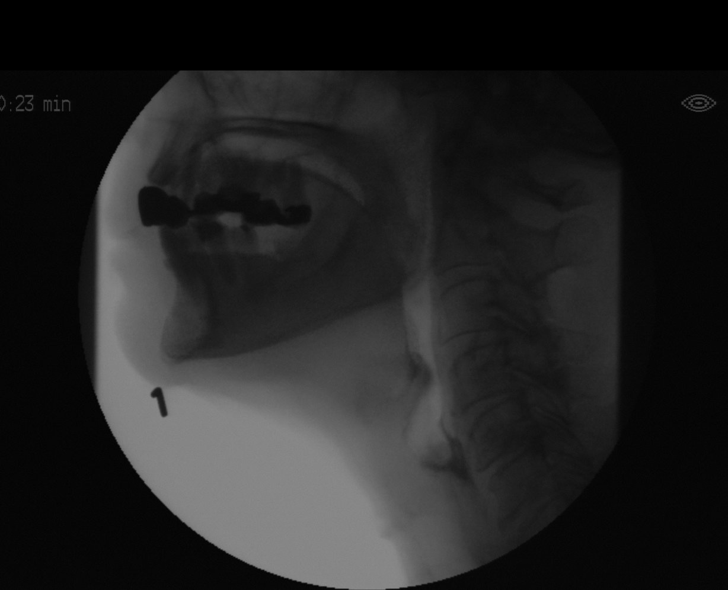

[Series 9: run · 1 of 254 frames shown (5 of 12)]
[frame 13/254]
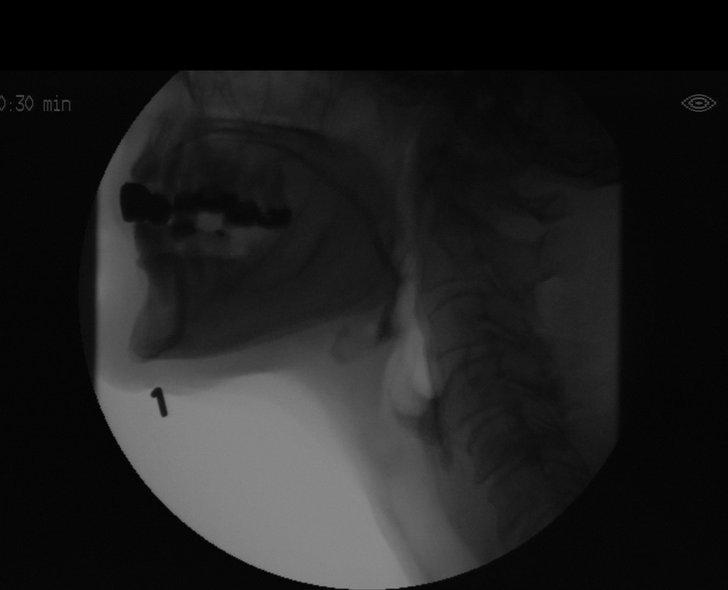

[Series 11: run · 1 of 213 frames shown (6 of 12)]
[frame 2/213]
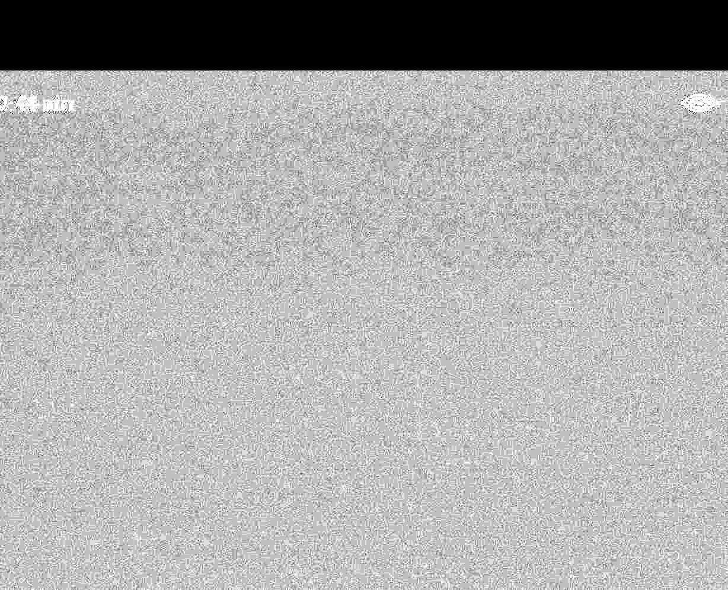

[Series 12: run · 1 of 50 frames shown (7 of 12)]
[frame 43/50]
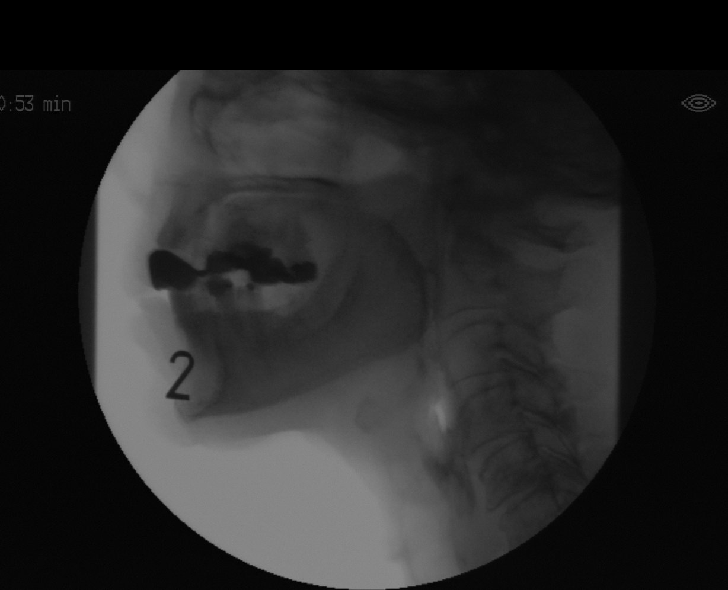

[Series 14: run · 1 of 259 frames shown (8 of 12)]
[frame 130/259]
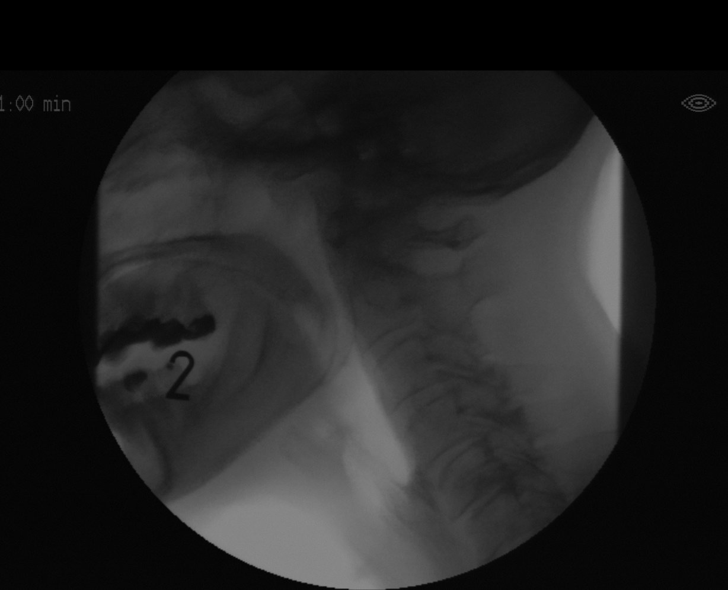

[Series 16: run · 1 of 99 frames shown (9 of 12)]
[frame 15/99]
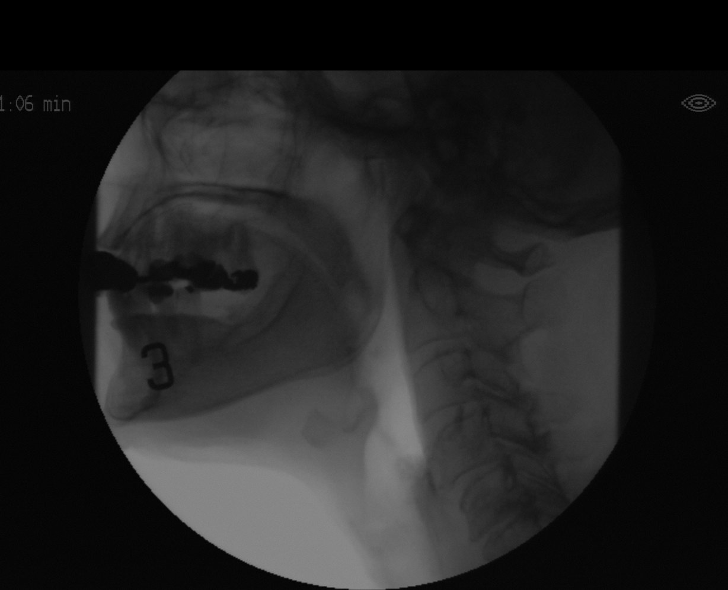

[Series 18: run · 1 of 521 frames shown (10 of 12)]
[frame 79/521]
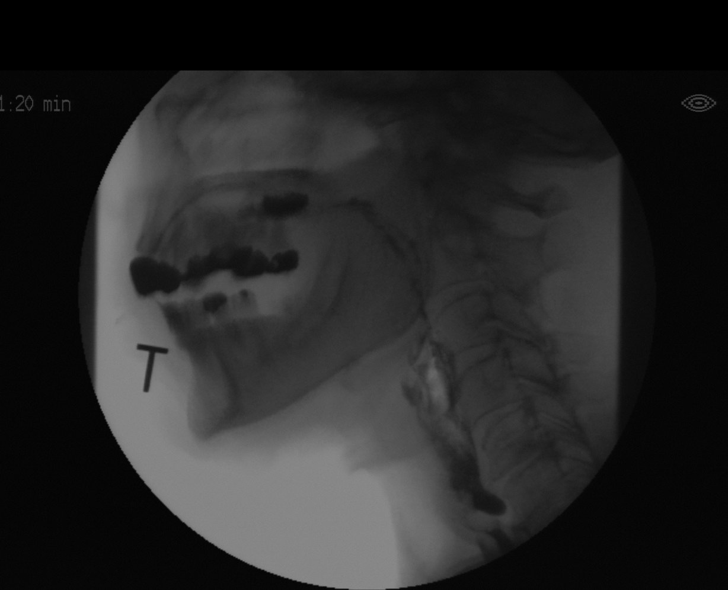

[Series 20: run · 1 of 195 frames shown (11 of 12)]
[frame 1/195]
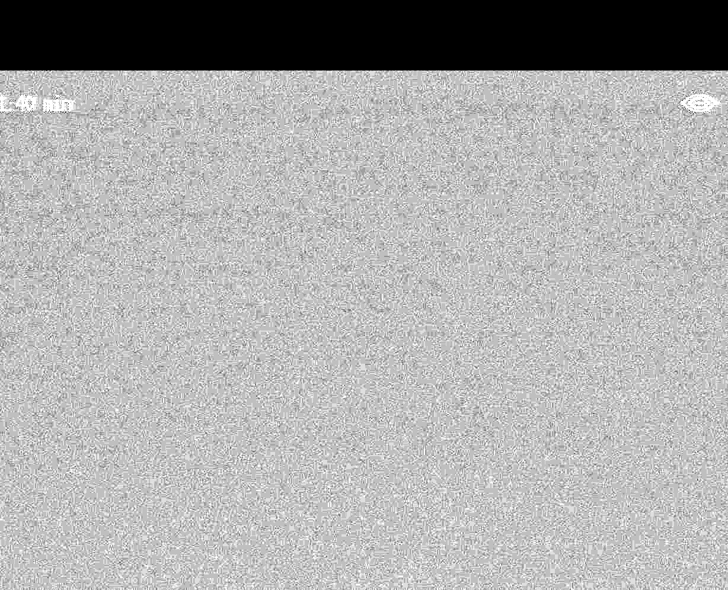

[Series 21: run · 1 of 104 frames shown (12 of 12)]
[frame 89/104]
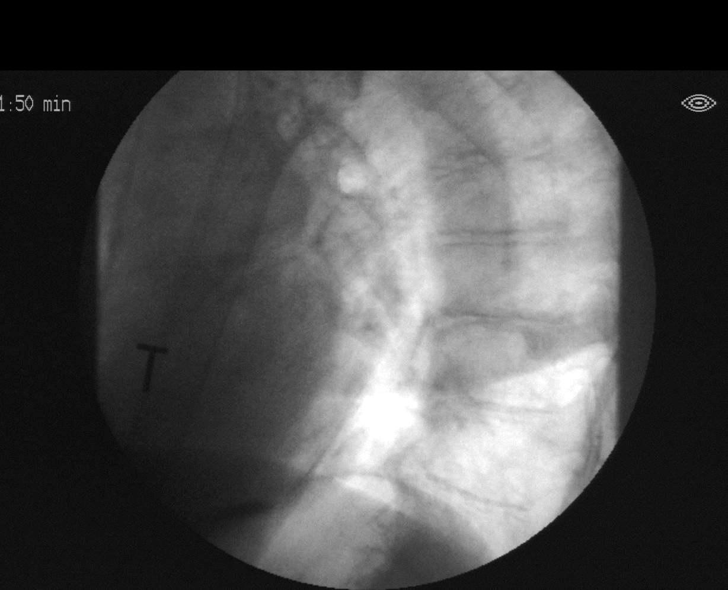

[12 of 24 positions shown; findings below may reference images not displayed]

FINDINGS: Swallowing across multiple consistencies ranging from thin barium to
barium with cracker showed no signs of aspiration, some evidence of
mild penetration.

Well-defined lobular, shelf-like filling defect along the posterior
wall of the esophagus at the C5-6 level. Stasis in the hypopharynx.

Please see speech pathology report for further detail regarding the
swallowing portion of the evaluation.
IMPRESSION: Suspect large CP bar, somewhat more polypoid than expected however,
CP bar could suggest gastroesophageal reflux as a cause for
symptoms. Contrast outlines this more than expected for CP bar
though this may simply be a robust CP bar. Given the mildly atypical
appearance and stasis of contrast above this level would suggest
direct visualization to exclude small polypoid lesion.

The possibility of large CP bar as the primary differential
consideration, a finding that can be seen in the setting of
gastroesophageal reflux. Correlate with any symptoms that would
suggest esophageal pathology with dedicated evaluation as warranted.

These results will be called to the ordering clinician or
representative by the Radiologist Assistant, and communication
documented in the PACS or [REDACTED].

Please refer to the Speech Pathologists report for complete details
and recommendations.

## 2023-10-25 ENCOUNTER — Other Ambulatory Visit: Payer: Self-pay | Admitting: Emergency Medicine

## 2023-10-25 DIAGNOSIS — F0280 Dementia in other diseases classified elsewhere without behavioral disturbance: Secondary | ICD-10-CM

## 2023-11-20 NOTE — Discharge Instructions (Signed)

## 2023-11-21 ENCOUNTER — Inpatient Hospital Stay: Admission: RE | Admit: 2023-11-21 | Payer: No Typology Code available for payment source | Source: Ambulatory Visit

## 2023-11-21 ENCOUNTER — Ambulatory Visit
Admission: RE | Admit: 2023-11-21 | Discharge: 2023-11-21 | Disposition: A | Discharge status: Home / Self Care | Source: Ambulatory Visit | Attending: Emergency Medicine | Admitting: Emergency Medicine

## 2023-11-21 DIAGNOSIS — F0280 Dementia in other diseases classified elsewhere without behavioral disturbance: Secondary | ICD-10-CM

## 2024-05-22 ENCOUNTER — Encounter: Payer: Self-pay | Admitting: Sports Medicine
